# Patient Record
Sex: Female | Born: 1985
Health system: Southern US, Community
[De-identification: ages and names within clinical notes are randomized; demographics above are authoritative.]

## PROBLEM LIST (undated history)

## (undated) DIAGNOSIS — T7840XA Allergy, unspecified, initial encounter: Secondary | ICD-10-CM

## (undated) DIAGNOSIS — F32A Depression, unspecified: Secondary | ICD-10-CM

## (undated) DIAGNOSIS — G43909 Migraine, unspecified, not intractable, without status migrainosus: Secondary | ICD-10-CM

## (undated) DIAGNOSIS — R87629 Unspecified abnormal cytological findings in specimens from vagina: Secondary | ICD-10-CM

## (undated) DIAGNOSIS — D649 Anemia, unspecified: Secondary | ICD-10-CM

## (undated) DIAGNOSIS — F329 Major depressive disorder, single episode, unspecified: Secondary | ICD-10-CM

## (undated) DIAGNOSIS — F419 Anxiety disorder, unspecified: Secondary | ICD-10-CM

## (undated) DIAGNOSIS — F4310 Post-traumatic stress disorder, unspecified: Secondary | ICD-10-CM

## (undated) HISTORY — DX: Post-traumatic stress disorder, unspecified: F43.10

## (undated) HISTORY — PX: TONSILLECTOMY: SUR1361

## (undated) HISTORY — DX: Major depressive disorder, single episode, unspecified: F32.9

## (undated) HISTORY — PX: COSMETIC SURGERY: SHX468

## (undated) HISTORY — PX: WISDOM TOOTH EXTRACTION: SHX21

## (undated) HISTORY — DX: Migraine, unspecified, not intractable, without status migrainosus: G43.909

## (undated) HISTORY — DX: Anemia, unspecified: D64.9

## (undated) HISTORY — DX: Unspecified abnormal cytological findings in specimens from vagina: R87.629

## (undated) HISTORY — PX: BREAST REDUCTION SURGERY: SHX8

## (undated) HISTORY — DX: Depression, unspecified: F32.A

## (undated) HISTORY — DX: Anxiety disorder, unspecified: F41.9

## (undated) HISTORY — DX: Allergy, unspecified, initial encounter: T78.40XA

## (undated) HISTORY — PX: BREAST SURGERY: SHX581

---

## 2012-08-02 ENCOUNTER — Ambulatory Visit: Payer: Self-pay | Admitting: Physician Assistant

## 2012-08-02 VITALS — BP 110/70 | HR 72 | Temp 98.2°F | Resp 16 | Ht 67.0 in | Wt 173.0 lb

## 2012-08-02 DIAGNOSIS — Z0289 Encounter for other administrative examinations: Secondary | ICD-10-CM

## 2012-08-02 NOTE — Progress Notes (Signed)
Patient ID: Katherine Fletcher MRN: [id number], DOB: July 18, 1985 27 y.o. Date of Encounter: 08/02/2012, 12:20 PM  Primary Physician: No primary provider on file.  Chief Complaint: DOT Physical   HPI: 27 y.o. y/o female with history noted below here for DOT physical. Doing well. No issues/complaints. Generally healthy. Recertification. Usually gets a two year card. Rarely drinks alcohol if she drinks at all. If she drinks she will have a glass of wine a month. Eating healthy and exercising regularly.    Review of Systems: Consitutional: No fever, chills, fatigue, night sweats, lymphadenopathy, or weight changes. Eyes: No visual changes, eye redness, or discharge. ENT/Mouth: Ears: No otalgia, tinnitus, hearing loss, discharge. Nose: No congestion, rhinorrhea, sinus pain, or epistaxis. Throat: No sore throat, post nasal drip, or teeth pain. Cardiovascular: No CP, palpitations, diaphoresis, DOE, edema, orthopnea, PND. Respiratory: No cough, hemoptysis, SOB, or wheezing. Gastrointestinal: No anorexia, dysphagia, reflux, pain, nausea, vomiting, hematemesis, diarrhea, constipation, BRBPR, or melena. Genitourinary: No dysuria, frequency, urgency, hematuria, incontinence, nocturia, decreased urinary stream, discharge, impotence, or testicular pain/masses. Musculoskeletal: No decreased ROM, myalgias, stiffness, joint swelling, or weakness. Skin: No rash, erythema, lesion changes, pain, warmth, jaundice, or pruritis. Neurological: No headache, dizziness, syncope, seizures, tremors, memory loss, coordination problems, or paresthesias. Psychological: No anxiety, depression, hallucinations, SI/HI. Endocrine: No fatigue, polydipsia, polyphagia, polyuria, or known diabetes. All other systems were reviewed and are otherwise negative.  Past Medical History  Diagnosis Date  . Allergy   . Depression   . Migraine   . Anemia      Past Surgical History  Procedure Laterality Date  . Tonsillectomy    .  Wisdom tooth extraction      Home Meds:  Prior to Admission medications   Not on File    Allergies:  Allergies  Allergen Reactions  . Other     Opiates-GI Upset  . Latex Rash    History   Social History  . Marital Status: Married    Spouse Name: N/A    Number of Children: N/A  . Years of Education: N/A   Occupational History  . Not on file.   Social History Main Topics  . Smoking status: Never Smoker   . Smokeless tobacco: Never Used  . Alcohol Use: Yes  . Drug Use: No  . Sexually Active: No   Other Topics Concern  . Not on file   Social History Narrative  . No narrative on file    Family History  Problem Relation Age of Onset  . Obesity Mother   . Depression Mother   . Arthritis Mother   . Obesity Father   . Rheum arthritis Father   . Hypothyroidism Father   . Obesity Sister   . Polycystic ovary syndrome Sister   . Cancer Maternal Grandmother   . Heart disease Maternal Grandfather   . Hypothyroidism Paternal Grandmother   . Arthritis Paternal Grandmother   . Heart attack Paternal Grandfather   . Arthritis Paternal Grandfather     Physical Exam: Blood pressure 110/70, pulse 72, temperature 98.2 F (36.8 C), resp. rate 16, height 5\' 7"  (1.702 m), weight 173 lb (78.472 kg), last menstrual period 07/12/2012. Body mass index is 27.09 kg/(m^2).  General: Well developed, well nourished, in no acute distress. HEENT: Normocephalic, atraumatic. Conjunctiva pink, sclera non-icteric. Pupils 2 mm constricting to 1 mm, round, regular, and equally reactive to light and accomodation. EOMI. Internal auditory canal clear. TMs with good cone of light and without pathology. Nasal mucosa pink. Nares  are without discharge. No sinus tenderness. Oral mucosa pink. Dentition normal. Pharynx without exudate.   Neck: Supple. Trachea midline. No thyromegaly. Full ROM. No lymphadenopathy. Lungs: Clear to auscultation bilaterally without wheezes, rales, or rhonchi. Breathing is of  normal effort and unlabored. Cardiovascular: RRR with S1 S2. No murmurs, rubs, or gallops appreciated. Distal pulses 2+ symmetrically. No carotid or abdominal bruits. Abdomen: Soft, non-tender, non-distended with normoactive bowel sounds. No hepatosplenomegaly or masses. No rebound/guarding. No CVA tenderness. Without hernias.   Musculoskeletal: Full range of motion and 5/5 strength throughout. Without swelling, atrophy, tenderness, crepitus, or warmth. Extremities without clubbing, cyanosis, or edema. Calves supple. Skin: Warm and moist without erythema, ecchymosis, wounds, or rash. Neuro: A+Ox3. CN II-XII grossly intact. Moves all extremities spontaneously. Full sensation throughout. Normal gait. DTR 2+ throughout upper and lower extremities. Finger to nose intact. Psych:  Responds to questions appropriately with a normal affect.    Assessment/Plan:  27 y.o. y/o female here for DOT physical. -Cleared -Two year card issued -Form completed -RTC prn  Signed, Eula Listen, PA-C 08/02/2012 12:20 PM

## 2012-08-08 ENCOUNTER — Encounter: Payer: Self-pay | Admitting: Physician Assistant

## 2012-11-25 ENCOUNTER — Telehealth: Payer: Self-pay

## 2012-11-25 NOTE — Telephone Encounter (Signed)
Patient requested copy of DOT long form but back side was not scanned into Epic. Release form with incomplete DOT-PE long form is in [place] [facility]'s box for him to complete and sign the back which was not scanned in Epic. If the patient calls back asking about her release let her know DOT Card was faxed and DOT long form will be faxed as soon as it's completed.

## 2012-11-26 NOTE — Telephone Encounter (Signed)
Provider states he is unable to complete the back of the form due to urine dip not being documented in Epic. Form taken to [place] at [address] and she will call patient.

## 2014-09-03 ENCOUNTER — Ambulatory Visit (INDEPENDENT_AMBULATORY_CARE_PROVIDER_SITE_OTHER): Payer: Self-pay | Admitting: Family Medicine

## 2014-09-03 VITALS — BP 105/60 | HR 73 | Temp 98.0°F | Resp 16 | Ht 68.0 in | Wt 172.0 lb

## 2014-09-03 DIAGNOSIS — Z021 Encounter for pre-employment examination: Secondary | ICD-10-CM

## 2014-09-03 DIAGNOSIS — Z024 Encounter for examination for driving license: Secondary | ICD-10-CM

## 2014-09-03 NOTE — Patient Instructions (Signed)
Return in 2 years, sooner if needed

## 2014-09-03 NOTE — Progress Notes (Signed)
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 She has no major acute medical complaints.  Past medical history: Operations: Tonsillectomy Major illnesses: None; does have irritable bowel Allergies: None known Medications: She takes a number of supplements. Only prescription medicine is dicyclomine which she infrequently takes for irritable bowel.  Family history: Both parents are obese, have high cholesterol. Mother has high blood pressure.  Social history: Patient is married. She has a college degree in biology and monitoring and chemistry. She hopes to go to graduate school this fall [employer]. 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  Review of systems: Unremarkable. She has lost 150 pounds over the last 8 years by dietary change in exercise  Physical exam: Pleasant young lady in no acute distress. TMs normal. Eyes PERRLA. Throat clear. Neck supple without nodes or thyromegaly. Chest clear to auscultation. Heart regular without murmurs. Abdomen soft without mass or tenderness. Extremities unremarkable. Skin unremarkable.  Assessment: DOT physical exam History of irritable bowel syndrome Large weight loss, intentional lifestyle change  Plan: 2 year DOT card

## 2018-02-05 DIAGNOSIS — J329 Chronic sinusitis, unspecified: Secondary | ICD-10-CM | POA: Insufficient documentation

## 2018-02-27 DIAGNOSIS — J3081 Allergic rhinitis due to animal (cat) (dog) hair and dander: Secondary | ICD-10-CM | POA: Insufficient documentation

## 2019-03-05 ENCOUNTER — Other Ambulatory Visit: Payer: Self-pay | Admitting: *Deleted

## 2019-03-05 DIAGNOSIS — Z20822 Contact with and (suspected) exposure to covid-19: Secondary | ICD-10-CM

## 2019-03-06 LAB — NOVEL CORONAVIRUS, NAA: SARS-CoV-2, NAA: NOT DETECTED

## 2019-03-10 ENCOUNTER — Telehealth: Payer: Self-pay | Admitting: General Practice

## 2019-03-10 NOTE — Telephone Encounter (Signed)
Pt aware covid lab test negative, not detected °

## 2019-04-03 ENCOUNTER — Other Ambulatory Visit: Payer: Self-pay

## 2019-04-03 DIAGNOSIS — Z20822 Contact with and (suspected) exposure to covid-19: Secondary | ICD-10-CM

## 2019-04-05 LAB — NOVEL CORONAVIRUS, NAA: SARS-CoV-2, NAA: NOT DETECTED

## 2019-04-07 ENCOUNTER — Telehealth: Payer: Self-pay | Admitting: *Deleted

## 2019-04-07 NOTE — Telephone Encounter (Signed)
Pt calling for covid results; negative. Pt verbalizes understanding.

## 2019-05-06 DIAGNOSIS — B977 Papillomavirus as the cause of diseases classified elsewhere: Secondary | ICD-10-CM | POA: Insufficient documentation

## 2019-06-12 DIAGNOSIS — Z789 Other specified health status: Secondary | ICD-10-CM | POA: Insufficient documentation

## 2019-09-29 DIAGNOSIS — F39 Unspecified mood [affective] disorder: Secondary | ICD-10-CM | POA: Insufficient documentation

## 2019-11-04 DIAGNOSIS — Z98891 History of uterine scar from previous surgery: Secondary | ICD-10-CM

## 2019-11-04 HISTORY — DX: History of uterine scar from previous surgery: Z98.891

## 2020-05-10 ENCOUNTER — Ambulatory Visit (INDEPENDENT_AMBULATORY_CARE_PROVIDER_SITE_OTHER): Payer: Medicaid Other

## 2020-05-10 ENCOUNTER — Ambulatory Visit
Admission: RE | Admit: 2020-05-10 | Discharge: 2020-05-10 | Disposition: A | Discharge status: Home / Self Care | Payer: Medicaid Other | Source: Ambulatory Visit

## 2020-05-10 ENCOUNTER — Other Ambulatory Visit: Payer: Self-pay

## 2020-05-10 VITALS — BP 113/68 | HR 68 | Temp 98.3°F | Resp 16

## 2020-05-10 DIAGNOSIS — R4586 Emotional lability: Secondary | ICD-10-CM

## 2020-05-10 DIAGNOSIS — M25532 Pain in left wrist: Secondary | ICD-10-CM

## 2020-05-10 DIAGNOSIS — M659 Synovitis and tenosynovitis, unspecified: Secondary | ICD-10-CM | POA: Diagnosis not present

## 2020-05-10 DIAGNOSIS — M25522 Pain in left elbow: Secondary | ICD-10-CM

## 2020-05-10 DIAGNOSIS — M25512 Pain in left shoulder: Secondary | ICD-10-CM

## 2020-05-10 DIAGNOSIS — Z8659 Personal history of other mental and behavioral disorders: Secondary | ICD-10-CM

## 2020-05-10 MED ORDER — PREDNISONE 20 MG PO TABS: ORAL_TABLET | ORAL | 0 refills | Status: DC

## 2020-05-10 NOTE — ED Triage Notes (Signed)
Pt presents with left wrist pain for past few months, began in  August but has become worse Also, she has additional complaints of mood swings

## 2020-05-10 NOTE — ED Provider Notes (Signed)
[facility]   MRN: [id number] DOB: 1986-02-06  Subjective:   Katherine Fletcher is a 34 y.o. female presenting for several month history of persistent left wrist pain.  Has also had intermittent recurrent pain over the radial aspect of her proximal forearm, left shoulder pain.  Patient has a history of a left elbow injury.  She has a 47-month-old and admits that she has to do a lot of carrying and taking care of her son.  He is in >95% for his growth.  She has used ibuprofen and then naproxen but is starting to bother her stomach and is giving her very temporary relief.  She also has concerns about mood swings and would like to get blood work done for this.  She has a history of PTSD and has previously tried antidepressants but these have not generally helped her.  She has gone to behavioral therapy but is not doing this now.  No current facility-administered medications for this encounter.  Current Outpatient Medications:    norethindrone (MICRONOR) 0.35 MG tablet, Take 1 tablet by mouth daily., Disp: , Rfl:    No Known Allergies  No past medical history on file.    Family History  Family history unknown: Yes    Social History   Tobacco Use   Smoking status: Never Smoker   Smokeless tobacco: Never Used  Substance Use Topics   Alcohol use: Not Currently   Drug use: Never    ROS   Objective:   Vitals: BP 113/68 (BP Location: Right Arm)    Pulse 68    Temp 98.3 F (36.8 C) (Oral)    Resp 16    LMP 05/03/2020    SpO2 98%   Physical Exam Constitutional:      General: She is not in acute distress.    Appearance: Normal appearance. She is well-developed. She is not ill-appearing, toxic-appearing or diaphoretic.  HENT:     Head: Normocephalic and atraumatic.     Nose: Nose normal.     Mouth/Throat:     Mouth: Mucous membranes are moist.     Pharynx: Oropharynx is clear.  Eyes:     General: No scleral icterus.    Extraocular Movements: Extraocular  movements intact.     Pupils: Pupils are equal, round, and reactive to light.  Cardiovascular:     Rate and Rhythm: Normal rate.  Pulmonary:     Effort: Pulmonary effort is normal.  Musculoskeletal:       Arms:       Hands:  Skin:    General: Skin is warm and dry.  Neurological:     General: No focal deficit present.     Mental Status: She is alert and oriented to person, place, and time.  Psychiatric:        Mood and Affect: Mood normal.        Behavior: Behavior normal.     DG Wrist Complete Left  Result Date: 05/10/2020 CLINICAL DATA:  Left wrist pain for several months. No known injury. EXAM: LEFT WRIST - COMPLETE 3+ VIEW COMPARISON:  None. FINDINGS: There is no evidence of fracture or dislocation. There is no evidence of arthropathy or other focal bone abnormality. Soft tissues are unremarkable. IMPRESSION: Normal exam. Electronically Signed   By: Drusilla Kanner M.D.   On: 05/10/2020 11:25   Assessment and Plan :   PDMP not reviewed this encounter.  1. Tenosynovitis of left hand   2. Elbow pain, left   3.  Acute pain of left shoulder   4. Mood swings   5. History of posttraumatic stress disorder (PTSD)     Recommended oral prednisone course that she is responding some but not completely to NSAID course.  Counseled that having to perform a lot of childcare is a likely contributor to her musculoskeletal pain.  However, recommend that she follow-up with an orthopedist especially if she continues to have tenosynovitis of her left wrist/hand.  Follow-up and establish care with a new PCP, information provided to her for this.  Counseled against starting antidepressant medications until she has a good work-up. Counseled patient on potential for adverse effects with medications prescribed/recommended today, ER and return-to-clinic precautions discussed, patient verbalized understanding.    Wallis Bamberg, PA-C 05/10/20 1213

## 2020-05-11 DIAGNOSIS — M654 Radial styloid tenosynovitis [de Quervain]: Secondary | ICD-10-CM | POA: Diagnosis not present

## 2020-05-11 DIAGNOSIS — M25532 Pain in left wrist: Secondary | ICD-10-CM | POA: Diagnosis not present

## 2020-05-26 ENCOUNTER — Encounter: Payer: Self-pay | Admitting: Internal Medicine

## 2020-05-26 ENCOUNTER — Ambulatory Visit: Payer: Medicaid Other | Admitting: Internal Medicine

## 2020-05-26 ENCOUNTER — Other Ambulatory Visit: Payer: Self-pay

## 2020-05-26 VITALS — BP 97/63 | HR 86 | Temp 98.3°F | Resp 18 | Ht 68.0 in | Wt 190.1 lb

## 2020-05-26 DIAGNOSIS — F39 Unspecified mood [affective] disorder: Secondary | ICD-10-CM

## 2020-05-26 DIAGNOSIS — Z7689 Persons encountering health services in other specified circumstances: Secondary | ICD-10-CM

## 2020-05-26 DIAGNOSIS — R5383 Other fatigue: Secondary | ICD-10-CM | POA: Insufficient documentation

## 2020-05-26 DIAGNOSIS — D649 Anemia, unspecified: Secondary | ICD-10-CM | POA: Diagnosis not present

## 2020-05-26 NOTE — Assessment & Plan Note (Signed)
Reports h/o depression and PTSD Has tried Zoloft and Wellbutrin in the past Check CBC and TSH before starting treatment for depression Has had suicidal ideation in the past, no suicidal or homicidal ideation currently

## 2020-05-26 NOTE — Patient Instructions (Signed)
Please start taking Vitamin B12 supplements - total of 1000 mcg daily.  Continue to take Multivitamins and Omega-3.  Please continue to follow heart healthy diet and perform moderate exercise/walking at least 150 mins/week.

## 2020-05-26 NOTE — Progress Notes (Signed)
New Patient Office Visit  Subjective:  Patient ID: Katherine Fletcher, adult    DOB: 1986/02/09  Age: 36 y.o. MRN: [id number]  CC:  Chief Complaint  Patient presents with  . New Patient (Initial Visit)    New patient has been having mood swings she also would like to get iron levels checked and have her thyroid checked     HPI Katherine Fletcher is a 35 year old non-binary (female at birth) with PMH of anemia, depression and PTSD who presents for establishing care.  Patient had C-section in 10/2019. Patient started having depressed mood 2 months after that and has been having decreased appetite and does not like to perform activities that she used to like. Denies sleep difficulty, suicidal or homicidal ideation. Mentions h/o MDD and PTSD in the past, has been on Zoloft and Wellbutrin many years ago.  She has a h/o HPV, but last PAP smear was negative according to her. She follows up with OB/GYN.  Patient reports fatigue throughout the day and attributes it to decreased sleep as she is taking care of her infant.  Patient is up-to-date with COVID and flu vaccine.  Past Medical History:  Diagnosis Date  . Allergy   . Anemia   . Anxiety    Phreesia 05/23/2020  . Cesarean delivery delivered 11/21/2019  . Depression   . Depression    Phreesia 05/23/2020  . History of cesarean section 11/04/2019  . Migraine     Past Surgical History:  Procedure Laterality Date  . BREAST REDUCTION SURGERY    . BREAST SURGERY N/A    Phreesia 05/23/2020  . CESAREAN SECTION    . CESAREAN SECTION N/A    Phreesia 05/23/2020  . COSMETIC SURGERY N/A    Phreesia 05/23/2020  . TONSILLECTOMY    . WISDOM TOOTH EXTRACTION      Family History  Problem Relation Age of Onset  . Obesity Mother   . Depression Mother   . Arthritis Mother   . Obesity Father   . Rheum arthritis Father   . Hypothyroidism Father   . Obesity Sister   . Polycystic ovary syndrome Sister   . Cancer Maternal Grandmother   . Heart  disease Maternal Grandfather   . Hypothyroidism Paternal Grandmother   . Arthritis Paternal Grandmother   . Heart attack Paternal Grandfather   . Arthritis Paternal Grandfather     Social History   Socioeconomic History  . Marital status: Significant Other    Spouse name: Not on file  . Number of children: Not on file  . Years of education: Not on file  . Highest education level: Not on file  Occupational History  . Not on file  Tobacco Use  . Smoking status: Never Smoker  . Smokeless tobacco: Never Used  Substance and Sexual Activity  . Alcohol use: Yes  . Drug use: No  . Sexual activity: Never    Birth control/protection: Abstinence  Other Topics Concern  . Not on file  Social History Narrative   ** Merged History Encounter **       Social Determinants of Health   Financial Resource Strain: Not on file  Food Insecurity: Not on file  Transportation Needs: Not on file  Physical Activity: Not on file  Stress: Not on file  Social Connections: Not on file  Intimate Partner Violence: Not on file    ROS Review of Systems  Constitutional: Negative for chills and fever.  HENT: Negative for congestion and sore throat.  Eyes: Negative for pain and discharge.  Respiratory: Negative for cough and shortness of breath.   Cardiovascular: Negative for chest pain and palpitations.  Gastrointestinal: Negative for constipation, diarrhea, nausea and vomiting.  Endocrine: Negative for polydipsia and polyuria.  Genitourinary: Negative for dysuria and hematuria.  Musculoskeletal: Negative for neck pain and neck stiffness.  Skin: Negative for rash.  Neurological: Negative for dizziness, weakness, numbness and headaches.  Psychiatric/Behavioral: Positive for dysphoric mood. Negative for agitation, behavioral problems, sleep disturbance and suicidal ideas. The patient is not nervous/anxious.     Objective:   Today's Vitals: BP 97/63 (BP Location: Right Arm, Patient Position:  Sitting, Cuff Size: Normal)   Pulse 86   Temp 98.3 F (36.8 C) (Oral)   Resp 18   Ht '5\' 8"'  (1.727 m)   Wt 190 lb 1.9 oz (86.2 kg)   LMP 05/03/2020   SpO2 98%   BMI 28.91 kg/m   Physical Exam Vitals reviewed.  Constitutional:      General: Katherine Fletcher is not in acute distress.    Appearance: Katherine Fletcher is not diaphoretic.  HENT:     Head: Normocephalic and atraumatic.     Nose: Nose normal.     Mouth/Throat:     Mouth: Mucous membranes are moist.  Eyes:     General: No scleral icterus.    Extraocular Movements: Extraocular movements intact.     Pupils: Pupils are equal, round, and reactive to light.  Cardiovascular:     Rate and Rhythm: Normal rate and regular rhythm.     Pulses: Normal pulses.     Heart sounds: Normal heart sounds. No murmur heard.   Pulmonary:     Breath sounds: Normal breath sounds. No wheezing or rales.  Abdominal:     Palpations: Abdomen is soft.     Tenderness: There is no abdominal tenderness.  Musculoskeletal:     Cervical back: Neck supple. No tenderness.     Right lower leg: No edema.     Left lower leg: No edema.  Skin:    General: Skin is warm.     Findings: No rash.  Neurological:     General: No focal deficit present.     Mental Status: Katherine Fletcher is alert and oriented to person, place, and time.     Sensory: No sensory deficit.     Motor: No weakness.  Psychiatric:        Mood and Affect: Mood normal.        Behavior: Behavior normal.     Assessment & Plan:   Problem List Items Addressed This Visit      Encounter to establish care - Primary   Care established Previous chart reviewed History and medications reviewed with the patient     Relevant Orders  CBC with Differential/Platelet  CMP14+EGFR  Lipid Profile  TSH + free T4  Vitamin D (25 hydroxy)    Other   Mood disorder (HCC)    Reports h/o depression and PTSD Has tried Zoloft and Wellbutrin in the past Check CBC and TSH before starting treatment for  depression Has had suicidal ideation in the past, no suicidal or homicidal ideation currently      Fatigue    H/o anemia, check CBC, iron profile and TSH Could be related to caregiver stress and/or depression       Other Visit Diagnoses    Anemia, unspecified type Reports h/o anemia Advised to continue multivitamins for now Vitamin B12 supplements as she is  vegetarian.   Relevant Orders   Fe+TIBC+Fer      Outpatient Encounter Medications as of 05/26/2020  Medication Sig  . dicyclomine (BENTYL) 10 MG/5ML syrup Take 20 mg by mouth 4 (four) times daily -  before meals and at bedtime. (Patient not taking: Reported on 05/26/2020)  . [DISCONTINUED] norethindrone (MICRONOR) 0.35 MG tablet Take 1 tablet by mouth daily. (Patient not taking: Reported on 05/26/2020)  . [DISCONTINUED] predniSONE (DELTASONE) 20 MG tablet Take 2 tablets daily with breakfast. (Patient not taking: Reported on 05/26/2020)   No facility-administered encounter medications on file as of 05/26/2020.    Follow-up: Return in about 6 months (around 11/23/2020).   Lindell Spar, MD

## 2020-05-26 NOTE — Assessment & Plan Note (Signed)
H/o anemia, check CBC, iron profile and TSH Could be related to caregiver stress and/or depression

## 2020-05-26 NOTE — Assessment & Plan Note (Signed)
Care established Previous chart reviewed History and medications reviewed with the patient 

## 2020-05-27 LAB — VITAMIN D 25 HYDROXY (VIT D DEFICIENCY, FRACTURES): Vit D, 25-Hydroxy: 30.3 ng/mL (ref 30.0–100.0)

## 2020-05-27 LAB — LIPID PANEL
Chol/HDL Ratio: 3.3 ratio (ref 0.0–4.4)
Cholesterol, Total: 156 mg/dL (ref 100–199)
HDL: 47 mg/dL (ref >39)
LDL Chol Calc (NIH): 95 mg/dL (ref 0–99)
Triglycerides: 74 mg/dL (ref 0–149)
VLDL Cholesterol Cal: 14 mg/dL (ref 5–40)

## 2020-05-27 LAB — CBC WITH DIFFERENTIAL/PLATELET
Basophils Absolute: 0 10*3/uL (ref 0.0–0.2)
Basos: 0 %
EOS (ABSOLUTE): 0 10*3/uL (ref 0.0–0.4)
Eos: 0 %
Hematocrit: 42.2 % (ref 34.0–46.6)
Hemoglobin: 14.1 g/dL (ref 11.1–15.9)
Immature Grans (Abs): 0 10*3/uL (ref 0.0–0.1)
Immature Granulocytes: 0 %
Lymphocytes Absolute: 1.3 10*3/uL (ref 0.7–3.1)
Lymphs: 22 %
MCH: 33 pg (ref 26.6–33.0)
MCHC: 33.4 g/dL (ref 31.5–35.7)
MCV: 99 fL — ABNORMAL HIGH (ref 79–97)
Monocytes Absolute: 0.5 10*3/uL (ref 0.1–0.9)
Monocytes: 8 %
Neutrophils Absolute: 4 10*3/uL (ref 1.4–7.0)
Neutrophils: 70 %
Platelets: 144 10*3/uL — ABNORMAL LOW (ref 150–450)
RBC: 4.27 x10E6/uL (ref 3.77–5.28)
RDW: 12.2 % (ref 11.7–15.4)
WBC: 5.9 10*3/uL (ref 3.4–10.8)

## 2020-05-27 LAB — CMP14+EGFR
ALT: 13 IU/L (ref 0–32)
AST: 13 IU/L (ref 0–40)
Albumin/Globulin Ratio: 1.9 (ref 1.2–2.2)
Albumin: 4.2 g/dL (ref 3.8–4.8)
Alkaline Phosphatase: 53 IU/L (ref 44–121)
BUN/Creatinine Ratio: 28 — ABNORMAL HIGH (ref 9–23)
BUN: 17 mg/dL (ref 6–20)
Bilirubin Total: 0.5 mg/dL (ref 0.0–1.2)
CO2: 23 mmol/L (ref 20–29)
Calcium: 8.9 mg/dL (ref 8.7–10.2)
Chloride: 104 mmol/L (ref 96–106)
Creatinine, Ser: 0.61 mg/dL (ref 0.57–1.00)
GFR calc Af Amer: 137 mL/min/{1.73_m2} (ref >59)
GFR calc non Af Amer: 119 mL/min/{1.73_m2} (ref >59)
Globulin, Total: 2.2 g/dL (ref 1.5–4.5)
Glucose: 86 mg/dL (ref 65–99)
Potassium: 4.4 mmol/L (ref 3.5–5.2)
Sodium: 142 mmol/L (ref 134–144)
Total Protein: 6.4 g/dL (ref 6.0–8.5)

## 2020-05-27 LAB — TSH+FREE T4
Free T4: 1.1 ng/dL (ref 0.82–1.77)
TSH: 1.12 u[IU]/mL (ref 0.450–4.500)

## 2020-06-30 DIAGNOSIS — Z20822 Contact with and (suspected) exposure to covid-19: Secondary | ICD-10-CM | POA: Diagnosis not present

## 2020-07-20 ENCOUNTER — Other Ambulatory Visit: Payer: Self-pay

## 2020-07-20 NOTE — Patient Instructions (Signed)
Visit Information    Katherine Fletcher  - as a part of your Medicaid benefit, you are eligible for care management and care coordination services at no cost or copay. I was unable to reach you by phone today but would be happy to help you with your health related needs. Please feel free to call me @ [phone].   A member of the Managed Medicaid care management team will reach out to you again over the next 7 days.   Gus Puma, BSW, [facility]  [place]  High Risk Managed Medicaid Team

## 2020-07-20 NOTE — Patient Outreach (Signed)
Care Coordination  07/20/2020  [place] Date February 07, 1986 [id number]   Medicaid Managed Care   Unsuccessful Outreach Note  07/20/2020 Name: Katherine Fletcher MRN: [id number] DOB: 02-03-1986  Referred by: Anabel Halon, MD Reason for referral : High Risk Managed Medicaid (MM Unsuccessful Telephone Outreach)   An unsuccessful telephone outreach was attempted today. The patient was referred to the case management team for assistance with care management and care coordination.   Follow Up Plan: The care management team will reach out to the patient again over the next 7 days.   Gus Puma, BSW, [facility]  [place]  High Risk Managed Medicaid Team

## 2020-07-29 ENCOUNTER — Other Ambulatory Visit: Payer: Self-pay

## 2020-07-29 NOTE — Patient Outreach (Signed)
Care Coordination  07/29/2020  [name] 14-May-1986 [id number]   Medicaid Managed Care   Unsuccessful Outreach Note  07/29/2020 Name: Katherine Fletcher MRN: [id number] DOB: February 07, 1986  Referred by: Anabel Halon, MD Reason for referral : High Risk Managed Medicaid (MM Screen Unsuccessful Telephone Outreach)   A second unsuccessful telephone outreach was attempted today. The patient was referred to the case management team for assistance with care management and care coordination.   Follow Up Plan: The care management team will reach out to the patient again over the next 7 days.   Gus Puma, BSW, [facility]  [name]  High Risk Managed Medicaid Team

## 2020-07-29 NOTE — Patient Instructions (Signed)
Visit Information    Katherine Fletcher  - as a part of your Medicaid benefit, you are eligible for care management and care coordination services at no cost or copay. I was unable to reach you by phone today but would be happy to help you with your health related needs. Please feel free to call me @ [phone].   A member of the Managed Medicaid care management team will reach out to you again over the next 7 days.   Katherine Fletcher, BSW, [facility]  [place]  High Risk Managed Medicaid Team

## 2020-08-09 ENCOUNTER — Other Ambulatory Visit: Payer: Self-pay

## 2020-08-09 NOTE — Patient Instructions (Signed)
Visit Information    Katherine Fletcher  - as a part of your Medicaid benefit, you are eligible for care management and care coordination services at no cost or copay. I was unable to reach you by phone today but would be happy to help you with your health related needs. Please feel free to call me @ [phone].     Gus Puma, BSW, [facility]  [place]  High Risk Managed Medicaid Team

## 2020-08-09 NOTE — Patient Outreach (Signed)
Care Coordination  08/09/2020  Katherine Fletcher 11/18/1985 [id number]   Medicaid Managed Care   Unsuccessful Outreach Note  08/09/2020 Name: Katherine Fletcher MRN: [id number] DOB: [birth year]  Referred by: Anabel Halon, MD Reason for referral : High Risk Managed Medicaid (MM Screen Unsuccessful Telephone Outreach)   Third unsuccessful telephone outreach was attempted today. The patient was referred to the case management team for assistance with care management and care coordination. The patient's primary care provider has been notified of our unsuccessful attempts to make or maintain contact with the patient. The care management team is pleased to engage with this patient at any time in the future should he/she be interested in assistance from the care management team.   Follow Up Plan: The patient has been provided with contact information for the care management team and has been advised to call with any health related questions or concerns.   Gus Puma, BSW, [facility]  [place]  High Risk Managed Medicaid Team

## 2020-10-25 ENCOUNTER — Ambulatory Visit (INDEPENDENT_AMBULATORY_CARE_PROVIDER_SITE_OTHER): Payer: Medicaid Other | Admitting: Adult Health

## 2020-10-25 ENCOUNTER — Other Ambulatory Visit: Payer: Self-pay

## 2020-10-25 ENCOUNTER — Encounter: Payer: Self-pay | Admitting: Adult Health

## 2020-10-25 VITALS — BP 109/65 | HR 80 | Ht 69.0 in | Wt 192.0 lb

## 2020-10-25 DIAGNOSIS — Z3A01 Less than 8 weeks gestation of pregnancy: Secondary | ICD-10-CM | POA: Diagnosis not present

## 2020-10-25 DIAGNOSIS — Z3201 Encounter for pregnancy test, result positive: Secondary | ICD-10-CM | POA: Diagnosis not present

## 2020-10-25 DIAGNOSIS — Z98891 History of uterine scar from previous surgery: Secondary | ICD-10-CM | POA: Diagnosis not present

## 2020-10-25 DIAGNOSIS — O3680X0 Pregnancy with inconclusive fetal viability, not applicable or unspecified: Secondary | ICD-10-CM | POA: Diagnosis not present

## 2020-10-25 LAB — POCT URINE PREGNANCY: Preg Test, Ur: POSITIVE — AB

## 2020-10-25 NOTE — Progress Notes (Signed)
  Subjective:     Patient ID: Katherine Fletcher, adult   DOB: May 02, 1986, 35 y.o.   MRN: [id number]  HPI Katherine Fletcher is a 35 year old white female, single with SO, in for UPT, has missed a period. Katherine Fletcher is her partner. Pap was 10/14/2018 +HPV other PCP is Dr Allena Katz.  Review of Systems +missed period Some nausea Reviewed past medical,surgical, social and family history. Reviewed medications and allergies.     Objective:   Physical Exam BP 109/65 (BP Location: Right Arm, Patient Position: Sitting, Cuff Size: Normal)   Pulse 80   Ht 5\' 9"  (1.753 m)   Wt 192 lb (87.1 kg)   LMP 09/16/2020 (Exact Date)   BMI 28.35 kg/m UPT is positive. About 5+4 weeks by LMP with EDD 06/23/21. Skin warm and dry. Neck: mid line trachea, normal thyroid, good ROM, no lymphadenopathy noted. Lungs: clear to ausculation bilaterally. Cardiovascular: regular rate and rhythm.abdomen is soft and non tender. AA is 3 Fall risk is low Depression screen [facility] 2/9 10/25/2020 05/26/2020  Decreased Interest 1 3  Down, Depressed, Hopeless 1 3  PHQ - 2 Score 2 6  Altered sleeping 2 1  Tired, decreased energy 3 1  Change in appetite 1 2  Feeling bad or failure about yourself  2 3  Trouble concentrating 1 0  Moving slowly or fidgety/restless 0 3  Suicidal thoughts 0 0  PHQ-9 Score 11 16  Difficult doing work/chores - Somewhat difficult  Never took prozac.  GAD 7 : Generalized Anxiety Score 10/25/2020  Nervous, Anxious, on Edge 2  Control/stop worrying 2  Worry too much - different things 2  Trouble relaxing 2  Restless 1  Easily annoyed or irritable 3  Afraid - awful might happen 0  Total GAD 7 Score 12       Assessment:     1. Pregnancy test positive   2. Less than [redacted] weeks gestation of pregnancy Take OTC PNC  3. Encounter to determine fetal viability of pregnancy, single or unspecified fetus Return in 3 weeks for dating 12/25/2020  4. History of cesarean section Does not want repeat, had at [facility], due to cord was  wrapped     Plan:     Review handout by [facility]

## 2020-11-10 ENCOUNTER — Encounter: Payer: Medicaid Other | Admitting: Adult Health

## 2020-11-17 ENCOUNTER — Other Ambulatory Visit: Payer: Self-pay

## 2020-11-17 ENCOUNTER — Other Ambulatory Visit: Payer: Self-pay | Admitting: Adult Health

## 2020-11-17 ENCOUNTER — Ambulatory Visit (INDEPENDENT_AMBULATORY_CARE_PROVIDER_SITE_OTHER): Payer: Medicaid Other

## 2020-11-17 DIAGNOSIS — Z3A08 8 weeks gestation of pregnancy: Secondary | ICD-10-CM | POA: Diagnosis not present

## 2020-11-17 DIAGNOSIS — O3680X0 Pregnancy with inconclusive fetal viability, not applicable or unspecified: Secondary | ICD-10-CM

## 2020-11-23 ENCOUNTER — Ambulatory Visit: Payer: Medicaid Other | Admitting: Internal Medicine

## 2020-12-08 ENCOUNTER — Other Ambulatory Visit: Payer: Self-pay | Admitting: Obstetrics & Gynecology

## 2020-12-08 DIAGNOSIS — Z3682 Encounter for antenatal screening for nuchal translucency: Secondary | ICD-10-CM

## 2020-12-09 ENCOUNTER — Other Ambulatory Visit: Payer: Self-pay | Admitting: Obstetrics & Gynecology

## 2020-12-09 ENCOUNTER — Ambulatory Visit (INDEPENDENT_AMBULATORY_CARE_PROVIDER_SITE_OTHER): Payer: Medicaid Other | Admitting: Women's Health

## 2020-12-09 ENCOUNTER — Ambulatory Visit: Payer: Medicaid Other | Admitting: *Deleted

## 2020-12-09 ENCOUNTER — Encounter: Payer: Self-pay | Admitting: Women's Health

## 2020-12-09 ENCOUNTER — Other Ambulatory Visit: Payer: Self-pay

## 2020-12-09 ENCOUNTER — Ambulatory Visit (INDEPENDENT_AMBULATORY_CARE_PROVIDER_SITE_OTHER): Payer: Medicaid Other

## 2020-12-09 ENCOUNTER — Other Ambulatory Visit: Payer: Self-pay | Admitting: Women's Health

## 2020-12-09 VITALS — BP 120/72 | HR 72 | Wt 195.0 lb

## 2020-12-09 DIAGNOSIS — Z3682 Encounter for antenatal screening for nuchal translucency: Secondary | ICD-10-CM | POA: Diagnosis not present

## 2020-12-09 DIAGNOSIS — Z98891 History of uterine scar from previous surgery: Secondary | ICD-10-CM

## 2020-12-09 DIAGNOSIS — Z3481 Encounter for supervision of other normal pregnancy, first trimester: Secondary | ICD-10-CM

## 2020-12-09 DIAGNOSIS — Z3480 Encounter for supervision of other normal pregnancy, unspecified trimester: Secondary | ICD-10-CM | POA: Diagnosis not present

## 2020-12-09 DIAGNOSIS — Z3A12 12 weeks gestation of pregnancy: Secondary | ICD-10-CM

## 2020-12-09 DIAGNOSIS — F418 Other specified anxiety disorders: Secondary | ICD-10-CM | POA: Diagnosis not present

## 2020-12-09 DIAGNOSIS — Z3490 Encounter for supervision of normal pregnancy, unspecified, unspecified trimester: Secondary | ICD-10-CM | POA: Insufficient documentation

## 2020-12-09 LAB — POCT URINALYSIS DIPSTICK OB
Blood, UA: NEGATIVE
Glucose, UA: NEGATIVE
Ketones, UA: NEGATIVE
Leukocytes, UA: NEGATIVE
Nitrite, UA: NEGATIVE
POC,PROTEIN,UA: NEGATIVE

## 2020-12-09 NOTE — Patient Instructions (Signed)
Katherine Fletcher, thank you for choosing our office today! We appreciate the opportunity to meet your healthcare needs. You may receive a short survey by mail, e-mail, or through MyChart. If you are happy with your care we would appreciate if you could take just a few minutes to complete the survey questions. We read all of your comments and take your feedback very seriously. Thank you again for choosing our office.  [facility]  [facility] ([address] [place], [place] [address]) Entrance C, located off of [address] Free 24/7 valet parking   Nausea & Vomiting Have saltine crackers or pretzels by your bed and eat a few bites before you raise your head out of bed in the morning Eat small frequent meals throughout the day instead of large meals Drink plenty of fluids throughout the day to stay hydrated, just don't drink a lot of fluids with your meals.  This can make your stomach fill up faster making you feel sick Do not brush your teeth right after you eat Products with real ginger are good for nausea, like ginger ale and ginger hard candy Make sure it says made with real ginger! Sucking on sour candy like lemon heads is also good for nausea If your prenatal vitamins make you nauseated, take them at night so you will sleep through the nausea Sea Bands If you feel like you need medicine for the nausea & vomiting please let us know If you are unable to keep any fluids or food down please let us know   Constipation Drink plenty of fluid, preferably water, throughout the day Eat foods high in fiber such as fruits, vegetables, and grains Exercise, such as walking, is a good way to keep your bowels regular Drink warm fluids, especially warm prune juice, or decaf coffee Eat a 1/2 cup of real oatmeal (not instant), 1/2 cup applesauce, and 1/2-1 cup warm prune juice every day If needed, you may take Colace (docusate sodium) stool softener  once or twice a day to help keep the stool soft.  If you still are having problems with constipation, you may take Miralax once daily as needed to help keep your bowels regular.   Home Blood Pressure Monitoring for Patients   Your provider has recommended that you check your blood pressure (BP) at least once a week at home. If you do not have a blood pressure cuff at home, one will be provided for you. Contact your provider if you have not received your monitor within 1 week.   Helpful Tips for Accurate Home Blood Pressure Checks  Don't smoke, exercise, or drink caffeine 30 minutes before checking your BP Use the restroom before checking your BP (a full bladder can raise your pressure) Relax in a comfortable upright chair Feet on the ground Left arm resting comfortably on a flat surface at the level of your heart Legs uncrossed Back supported Sit quietly and don't talk Place the cuff on your bare arm Adjust snuggly, so that only two fingertips can fit between your skin and the top of the cuff Check 2 readings separated by at least one minute Keep a log of your BP readings For a visual, please reference this diagram: http://ccnc.care/bpdiagram  Provider Name: [facility]     Phone: [phone]  Zone 1: ALL CLEAR  Continue to monitor your symptoms:  BP reading is less than 140 (top number) or less than 90 (bottom   number)  No right upper stomach pain No headaches or seeing spots No feeling nauseated or throwing up No swelling in face and hands  Zone 2: CAUTION Call your doctor's office for any of the following:  BP reading is greater than 140 (top number) or greater than 90 (bottom number)  Stomach pain under your ribs in the middle or right side Headaches or seeing spots Feeling nauseated or throwing up Swelling in face and hands  Zone 3: EMERGENCY  Seek immediate medical care if you have any of the following:  BP reading is greater than160 (top number) or greater than  110 (bottom number) Severe headaches not improving with Tylenol Serious difficulty catching your breath Any worsening symptoms from Zone 2    First Trimester of Pregnancy The first trimester of pregnancy is from week 1 until the end of week 12 (months 1 through 3). A week after a sperm fertilizes an egg, the egg will implant on the wall of the uterus. This embryo will begin to develop into a baby. Genes from you and your partner are forming the baby. The female genes determine whether the baby is a boy or a girl. At 6-8 weeks, the eyes and face are formed, and the heartbeat can be seen on ultrasound. At the end of 12 weeks, all the baby's organs are formed.  Now that you are pregnant, you will want to do everything you can to have a healthy baby. Two of the most important things are to get good prenatal care and to follow your health care provider's instructions. Prenatal care is all the medical care you receive before the baby's birth. This care will help prevent, find, and treat any problems during the pregnancy and childbirth. BODY CHANGES Your body goes through many changes during pregnancy. The changes vary from woman to woman.  You may gain or lose a couple of pounds at first. You may feel sick to your stomach (nauseous) and throw up (vomit). If the vomiting is uncontrollable, call your health care provider. You may tire easily. You may develop headaches that can be relieved by medicines approved by your health care provider. You may urinate more often. Painful urination may mean you have a bladder infection. You may develop heartburn as a result of your pregnancy. You may develop constipation because certain hormones are causing the muscles that push waste through your intestines to slow down. You may develop hemorrhoids or swollen, bulging veins (varicose veins). Your breasts may begin to grow larger and become tender. Your nipples may stick out more, and the tissue that surrounds them  (areola) may become darker. Your gums may bleed and may be sensitive to brushing and flossing. Dark spots or blotches (chloasma, mask of pregnancy) may develop on your face. This will likely fade after the baby is born. Your menstrual periods will stop. You may have a loss of appetite. You may develop cravings for certain kinds of food. You may have changes in your emotions from day to day, such as being excited to be pregnant or being concerned that something may go wrong with the pregnancy and baby. You may have more vivid and strange dreams. You may have changes in your hair. These can include thickening of your hair, rapid growth, and changes in texture. Some women also have hair loss during or after pregnancy, or hair that feels dry or thin. Your hair will most likely return to normal after your baby is born. WHAT TO EXPECT AT YOUR PRENATAL   VISITS During a routine prenatal visit: You will be weighed to make sure you and the baby are growing normally. Your blood pressure will be taken. Your abdomen will be measured to track your baby's growth. The fetal heartbeat will be listened to starting around week 10 or 12 of your pregnancy. Test results from any previous visits will be discussed. Your health care provider may ask you: How you are feeling. If you are feeling the baby move. If you have had any abnormal symptoms, such as leaking fluid, bleeding, severe headaches, or abdominal cramping. If you have any questions. Other tests that may be performed during your first trimester include: Blood tests to find your blood type and to check for the presence of any previous infections. They will also be used to check for low iron levels (anemia) and Rh antibodies. Later in the pregnancy, blood tests for diabetes will be done along with other tests if problems develop. Urine tests to check for infections, diabetes, or protein in the urine. An ultrasound to confirm the proper growth and development  of the baby. An amniocentesis to check for possible genetic problems. Fetal screens for spina bifida and Down syndrome. You may need other tests to make sure you and the baby are doing well. HOME CARE INSTRUCTIONS  Medicines Follow your health care provider's instructions regarding medicine use. Specific medicines may be either safe or unsafe to take during pregnancy. Take your prenatal vitamins as directed. If you develop constipation, try taking a stool softener if your health care provider approves. Diet Eat regular, well-balanced meals. Choose a variety of foods, such as meat or vegetable-based protein, fish, milk and low-fat dairy products, vegetables, fruits, and whole grain breads and cereals. Your health care provider will help you determine the amount of weight gain that is right for you. Avoid raw meat and uncooked cheese. These carry germs that can cause birth defects in the baby. Eating four or five small meals rather than three large meals a day may help relieve nausea and vomiting. If you start to feel nauseous, eating a few soda crackers can be helpful. Drinking liquids between meals instead of during meals also seems to help nausea and vomiting. If you develop constipation, eat more high-fiber foods, such as fresh vegetables or fruit and whole grains. Drink enough fluids to keep your urine clear or pale yellow. Activity and Exercise Exercise only as directed by your health care provider. Exercising will help you: Control your weight. Stay in shape. Be prepared for labor and delivery. Experiencing pain or cramping in the lower abdomen or low back is a good sign that you should stop exercising. Check with your health care provider before continuing normal exercises. Try to avoid standing for long periods of time. Move your legs often if you must stand in one place for a long time. Avoid heavy lifting. Wear low-heeled shoes, and practice good posture. You may continue to have sex  unless your health care provider directs you otherwise. Relief of Pain or Discomfort Wear a good support bra for breast tenderness.   Take warm sitz baths to soothe any pain or discomfort caused by hemorrhoids. Use hemorrhoid cream if your health care provider approves.   Rest with your legs elevated if you have leg cramps or low back pain. If you develop varicose veins in your legs, wear support hose. Elevate your feet for 15 minutes, 3-4 times a day. Limit salt in your diet. Prenatal Care Schedule your prenatal visits by the   twelfth week of pregnancy. They are usually scheduled monthly at first, then more often in the last 2 months before delivery. Write down your questions. Take them to your prenatal visits. Keep all your prenatal visits as directed by your health care provider. Safety Wear your seat belt at all times when driving. Make a list of emergency phone numbers, including numbers for family, friends, the hospital, and police and fire departments. General Tips Ask your health care provider for a referral to a local prenatal education class. Begin classes no later than at the beginning of month 6 of your pregnancy. Ask for help if you have counseling or nutritional needs during pregnancy. Your health care provider can offer advice or refer you to specialists for help with various needs. Do not use hot tubs, steam rooms, or saunas. Do not douche or use tampons or scented sanitary pads. Do not cross your legs for long periods of time. Avoid cat litter boxes and soil used by cats. These carry germs that can cause birth defects in the baby and possibly loss of the fetus by miscarriage or stillbirth. Avoid all smoking, herbs, alcohol, and medicines not prescribed by your health care provider. Chemicals in these affect the formation and growth of the baby. Schedule a dentist appointment. At home, brush your teeth with a soft toothbrush and be gentle when you floss. SEEK MEDICAL CARE IF:   You have dizziness. You have mild pelvic cramps, pelvic pressure, or nagging pain in the abdominal area. You have persistent nausea, vomiting, or diarrhea. You have a bad smelling vaginal discharge. You have pain with urination. You notice increased swelling in your face, hands, legs, or ankles. SEEK IMMEDIATE MEDICAL CARE IF:  You have a fever. You are leaking fluid from your vagina. You have spotting or bleeding from your vagina. You have severe abdominal cramping or pain. You have rapid weight gain or loss. You vomit blood or material that looks like coffee grounds. You are exposed to German measles and have never had them. You are exposed to fifth disease or chickenpox. You develop a severe headache. You have shortness of breath. You have any kind of trauma, such as from a fall or a car accident. Document Released: 05/02/2001 Document Revised: 09/22/2013 Document Reviewed: 03/18/2013 ExitCare Patient Information 2015 [employer]. This information is not intended to replace advice given to you by your health care provider. Make sure you discuss any questions you have with your health care provider.  

## 2020-12-09 NOTE — Progress Notes (Signed)
[place] 12 wks,measurements c/w dates,crl 58.73 mm,NT 1.4 mm,NB present,fhr 164 bpm,normal ovaries

## 2020-12-09 NOTE — Progress Notes (Signed)
INITIAL OBSTETRICAL VISIT Patient name: Katherine Fletcher MRN [id number]  Date of birth: 14-May-1986 Chief Complaint:   Initial Prenatal Visit  History of Present Illness:   Katherine Fletcher is a 35 y.o. G54P1001 Caucasian female at [redacted]w[redacted]d by LMP c/w u/s at 8 weeks with an Estimated Date of Delivery: 06/23/21 being seen today for her initial obstetrical visit.   Patient's last menstrual period was 09/16/2020 (exact date). Her obstetrical history is significant for C/S for NRFHR, wants TOLAC.   Today she reports nausea, unisom helps Last pap 11/21/19. Results were: NILM w/ HRHPV negative  Depression screen [facility] 2/9 12/09/2020 10/25/2020 05/26/2020  Decreased Interest 1 1 3   Down, Depressed, Hopeless 1 1 3   PHQ - 2 Score 2 2 6   Altered sleeping 0 2 1  Tired, decreased energy 3 3 1   Change in appetite 1 1 2   Feeling bad or failure about yourself  2 2 3   Trouble concentrating 1 1 0  Moving slowly or fidgety/restless 0 0 3  Suicidal thoughts 0 0 0  PHQ-9 Score 9 11 16   Difficult doing work/chores - - Somewhat difficult     GAD 7 : Generalized Anxiety Score 12/09/2020 10/25/2020  Nervous, Anxious, on Edge 1 2  Control/stop worrying 1 2  Worry too much - different things 1 2  Trouble relaxing 2 2  Restless 0 1  Easily annoyed or irritable 3 3  Afraid - awful might happen 0 0  Total GAD 7 Score 8 12     Review of Systems:   Pertinent items are noted in HPI Denies cramping/contractions, leakage of fluid, vaginal bleeding, abnormal vaginal discharge w/ itching/odor/irritation, headaches, visual changes, shortness of breath, chest pain, abdominal pain, severe nausea/vomiting, or problems with urination or bowel movements unless otherwise stated above.  Pertinent History Reviewed:  Reviewed past medical,surgical, social, obstetrical and family history.  Reviewed problem list, medications and allergies. OB History  Gravida Para Term Preterm AB Living  2 1 1     1   SAB IAB Ectopic Multiple Live Births           1    # Outcome Date GA Lbr Len/2nd Weight Sex Delivery Anes PTL Lv  2 Current           1 Term 10/27/19 [redacted]w[redacted]d  6 lb 12 oz (3.062 kg) M CS-LTranv EPI N LIV     Complications: Fetal Intolerance   Physical Assessment:   Vitals:   12/09/20 1059  BP: 120/72  Pulse: 72  Weight: 195 lb (88.5 kg)  Body mass index is 28.8 kg/m.       Physical Examination:  General appearance - well appearing, and in no distress  Mental status - alert, oriented to person, place, and time  Psych:  She has a normal mood and affect  Skin - warm and dry, normal color, no suspicious lesions noted  Chest - effort normal, all lung fields clear to auscultation bilaterally  Heart - normal rate and regular rhythm  Abdomen - soft, nontender  Extremities:  No swelling or varicosities noted  Thin prep pap is not done   Chaperone: N/A    TODAY'S NT  TA/TV:12 wks,measurements c/w dates,crl 58.73 mm,NT 1.4 mm,NB present,fhr 164 bpm,normal ovaries,limited view because of pt body habitus  Results for orders placed or performed in visit on 12/09/20 (from the past 24 hour(s))  POC Urinalysis Dipstick OB   Collection Time: 12/09/20 11:45 AM  Result Value Ref Range  Color, UA     Clarity, UA     Glucose, UA Negative Negative   Bilirubin, UA     Ketones, UA neg    Spec Grav, UA     Blood, UA neg    pH, UA     POC,PROTEIN,UA Negative Negative, Trace, Small (1+), Moderate (2+), Large (3+), 4+   Urobilinogen, UA     Nitrite, UA neg    Leukocytes, UA Negative Negative   Appearance     Odor      Assessment & Plan:  1) Low-Risk Pregnancy G2P1001 at [redacted]w[redacted]d with an Estimated Date of Delivery: 06/23/21   2) Initial OB visit  3) Prev c/s> for [facility], wants TOLAC, consent given to review  4) Dep/anx/PTSD/Bipolar> no meds, stable  Meds: No orders of the defined types were placed in this encounter.   Initial labs obtained Continue prenatal vitamins Reviewed n/v relief measures and warning s/s to  report Reviewed recommended weight gain based on pre-gravid BMI Encouraged well-balanced diet Genetic & carrier screening discussed: requests Panorama and NT/IT, declines Horizon  Ultrasound discussed; fetal survey: requested CCNC completed> form faxed if has or is planning to apply for medicaid The nature of [place] [facility] [employer] with multiple MDs and other Advanced Practice Providers was explained to patient; also emphasized that fellows, residents, and students are part of our team. Does have home bp cuff. Office bp cuff given: no. Rx sent: no. Check bp weekly, let us know if consistently >140/90.   Follow-up: Return in about 4 weeks (around 01/06/2021) for LROB, 2nd IT, CNM, in person.   Orders Placed This Encounter  Procedures   Urine Culture   GC/Chlamydia Probe Amp   Integrated 1   Genetic Screening   Pain Management Screening Profile (10S)   Hgb Fractionation Cascade   CBC/D/Plt+RPR+Rh+ABO+RubIgG...   POC Urinalysis Dipstick OB    Cheral Marker CNM, [facility] 12/09/2020 12:08 PM

## 2020-12-10 LAB — PMP SCREEN PROFILE (10S), URINE
Amphetamine Scrn, Ur: NEGATIVE ng/mL
BARBITURATE SCREEN URINE: NEGATIVE ng/mL
BENZODIAZEPINE SCREEN, URINE: NEGATIVE ng/mL
CANNABINOIDS UR QL SCN: NEGATIVE ng/mL
Cocaine (Metab) Scrn, Ur: NEGATIVE ng/mL
Creatinine(Crt), U: 65.5 mg/dL (ref 20.0–300.0)
Methadone Screen, Urine: NEGATIVE ng/mL
OXYCODONE+OXYMORPHONE UR QL SCN: NEGATIVE ng/mL
Opiate Scrn, Ur: NEGATIVE ng/mL
Ph of Urine: 7.5 (ref 4.5–8.9)
Phencyclidine Qn, Ur: NEGATIVE ng/mL
Propoxyphene Scrn, Ur: NEGATIVE ng/mL

## 2020-12-11 LAB — URINE CULTURE

## 2020-12-12 LAB — GC/CHLAMYDIA PROBE AMP
Chlamydia trachomatis, NAA: NEGATIVE
Neisseria Gonorrhoeae by PCR: NEGATIVE

## 2020-12-13 LAB — INTEGRATED 1
Crown Rump Length: 58.7 mm
Gest. Age on Collection Date: 12.1 weeks
Maternal Age at EDD: 35.5 yr
Nuchal Translucency (NT): 1.4 mm
Number of Fetuses: 1
PAPP-A Value: 1008.3 ng/mL
Weight: 195 [lb_av]

## 2020-12-13 LAB — HGB FRACTIONATION CASCADE
Hgb A2: 2.7 % (ref 1.8–3.2)
Hgb A: 97.3 % (ref 96.4–98.8)
Hgb F: 0 % (ref 0.0–2.0)
Hgb S: 0 %

## 2020-12-13 LAB — CBC/D/PLT+RPR+RH+ABO+RUBIGG...
Antibody Screen: NEGATIVE
Basophils Absolute: 0 10*3/uL (ref 0.0–0.2)
Basos: 0 %
EOS (ABSOLUTE): 0 10*3/uL (ref 0.0–0.4)
Eos: 0 %
HCV Ab: 0.2 s/co ratio (ref 0.0–0.9)
HIV Screen 4th Generation wRfx: NONREACTIVE
Hematocrit: 40.6 % (ref 34.0–46.6)
Hemoglobin: 14 g/dL (ref 11.1–15.9)
Hepatitis B Surface Ag: NEGATIVE
Immature Grans (Abs): 0 10*3/uL (ref 0.0–0.1)
Immature Granulocytes: 0 %
Lymphocytes Absolute: 1.5 10*3/uL (ref 0.7–3.1)
Lymphs: 21 %
MCH: 33.1 pg — ABNORMAL HIGH (ref 26.6–33.0)
MCHC: 34.5 g/dL (ref 31.5–35.7)
MCV: 96 fL (ref 79–97)
Monocytes Absolute: 0.4 10*3/uL (ref 0.1–0.9)
Monocytes: 6 %
Neutrophils Absolute: 5.2 10*3/uL (ref 1.4–7.0)
Neutrophils: 73 %
Platelets: 173 10*3/uL (ref 150–450)
RBC: 4.23 x10E6/uL (ref 3.77–5.28)
RDW: 12.7 % (ref 11.7–15.4)
RPR Ser Ql: NONREACTIVE
Rh Factor: POSITIVE
Rubella Antibodies, IGG: 3.46 index (ref >0.99)
WBC: 7.1 10*3/uL (ref 3.4–10.8)

## 2020-12-13 LAB — HCV INTERPRETATION

## 2021-01-03 ENCOUNTER — Ambulatory Visit: Payer: Medicaid Other | Admitting: Internal Medicine

## 2021-01-03 ENCOUNTER — Encounter: Payer: Self-pay | Admitting: Women's Health

## 2021-01-06 ENCOUNTER — Encounter: Payer: Self-pay | Admitting: Women's Health

## 2021-01-06 ENCOUNTER — Ambulatory Visit (INDEPENDENT_AMBULATORY_CARE_PROVIDER_SITE_OTHER): Payer: Medicaid Other | Admitting: Women's Health

## 2021-01-06 ENCOUNTER — Other Ambulatory Visit: Payer: Self-pay

## 2021-01-06 VITALS — BP 117/67 | HR 82 | Wt 197.0 lb

## 2021-01-06 DIAGNOSIS — Z363 Encounter for antenatal screening for malformations: Secondary | ICD-10-CM

## 2021-01-06 DIAGNOSIS — Z3A16 16 weeks gestation of pregnancy: Secondary | ICD-10-CM | POA: Diagnosis not present

## 2021-01-06 DIAGNOSIS — Z3480 Encounter for supervision of other normal pregnancy, unspecified trimester: Secondary | ICD-10-CM | POA: Diagnosis not present

## 2021-01-06 DIAGNOSIS — Z3482 Encounter for supervision of other normal pregnancy, second trimester: Secondary | ICD-10-CM

## 2021-01-06 NOTE — Patient Instructions (Addendum)
Katherine Fletcher, thank you for choosing our office today! We appreciate the opportunity to meet your healthcare needs. You may receive a short survey by mail, e-mail, or through [employer]. If you are happy with your care we would appreciate if you could take just a few minutes to complete the survey questions. We read all of your comments and take your feedback very seriously. Thank you again for choosing our office.  Center for [employer] Healthcare Team at [facility] at [facility] ([address] [place], [place] [address]) Entrance C, located off of [address] Free 24/7 valet parking   For Headaches:  Stay well hydrated, drink enough water so that your urine is clear, sometimes if you are dehydrated you can get headaches Eat small frequent meals and snacks, sometimes if you are hungry you can get headaches Sometimes you get headaches during pregnancy from the pregnancy hormones You can try tylenol (1-2 regular strength 325mg  or 1-2 extra strength 500mg ) as directed on the box. The least amount of medication that works is best.  Cool compresses (cool wet washcloth or ice pack) to area of head that is hurting You can also try drinking a caffeinated drink to see if this will help If not helping, try below:  For Prevention of Headaches/Migraines: CoQ10 100mg  three times daily Vitamin B2 400mg  daily Magnesium Oxide 400-600mg  daily  Foods to alleviate migraines:  1) dark leafy greens 2) avocado 3) tuna 4) salmon  5) beans and legumes  Foods to avoid: 1) Excessive (or irregular timing) coffee 3) aged cheeses 4) chocolate 5) citrus fruits 6) aspartame and other artifical sweeteners 7) yeast 8) MSG (in processed foods) 9) processed and cured meats 10) nuts and certain seeds 11) chicken livers and other organ meats 12) dairy products like buttermilk, sour cream, and yogurt 13) dried fruits like dates, figs, and raisins 14) garlic 15) onions 16) potato chips 17)  pickled foods like olives and sauerkraut 18) some fresh fruits like ripe banana, papaya, red plums, raspberries, kiwi, pineapple 19) tomato-based products  Recommend to keep a migraine diary: rate daily the severity of your headache (1-10) and what foods you eat that day to help determine patterns.   If You Get a Bad Headache/Migraine: Benadryl 25mg   Magnesium Oxide 1 large Gatorade 2 extra strength Tylenol (1,000mg  total) 1 cup coffee or Coke      If this doesn't help please call @ [phone]  Go to [email or web address] to register for FREE online childbirth classes  Call the office [phone]) or go to [facility] if: You begin to severe cramping Your water breaks.  Sometimes it is a big gush of fluid, sometimes it is just a trickle that keeps getting your panties wet or running down your legs You have vaginal bleeding.  It is normal to have a small amount of spotting if your cervix was checked.   [facility] Pediatricians/Family Doctors [facility]): [address] [phone], [phone]           [facility]: [address], [phone]                [facility]): [address], [phone] (call to ask if accepting patients) [facility] Department: [address], [place], [phone]    [facility] Pediatricians/Family Doctors [facility]): [phone], [phone] [facility]: [address] [place], [address] [facility]:  [place], [facility]): [phone] [facility]: [address] [phone]   [place]: [address] [facility]: [phone], [phone]  Home Blood Pressure Monitoring for Patients   Your  provider has recommended that you check your blood pressure (BP) at least once a week at home. If you do not have a blood pressure cuff at home, one will be provided for you. Contact your provider if you have not received your monitor within 1 week.   Helpful Tips for Accurate Home Blood Pressure Checks  Don't smoke, exercise, or drink caffeine 30 minutes before checking your BP Use the restroom before checking your BP (a full bladder can raise your pressure) Relax in a comfortable upright chair Feet on the ground Left arm resting comfortably on a flat surface at the level of your heart Legs uncrossed Back supported Sit quietly and don't talk Place the cuff on your bare arm Adjust snuggly, so that only two fingertips can fit between your skin and the top of the cuff Check 2 readings separated by at least one minute Keep a log of your BP readings For a visual, please reference this diagram: http://ccnc.care/bpdiagram  Provider Name: [facility]     Phone: [phone]  Zone 1: ALL CLEAR  Continue to monitor your symptoms:  BP reading is less than 140 (top number) or less than 90 (bottom number)  No right upper stomach pain No headaches or seeing spots No feeling nauseated or throwing up No swelling in face and hands  Zone 2: CAUTION Call your doctor's office for any of the following:  BP reading is greater than 140 (top number) or greater than 90 (bottom number)  Stomach pain under your ribs in the middle or right side Headaches or seeing spots Feeling nauseated or throwing up Swelling in face and hands  Zone 3: EMERGENCY  Seek immediate medical care if you have any of the following:  BP reading is greater than160 (top number) or greater than 110 (bottom number) Severe headaches not improving with Tylenol Serious difficulty catching your breath Any worsening symptoms from Zone 2     Second Trimester of Pregnancy The second trimester is from week 14 through week 27  (months 4 through 6). The second trimester is often a time when you feel your best. Your body has adjusted to being pregnant, and you begin to feel better physically. Usually, morning sickness has lessened or quit completely, you may have more energy, and you may have an increase in appetite. The second trimester is also a time when the fetus is growing rapidly. At the end of the sixth month, the fetus is about 9 inches long and weighs about 1 pounds. You will likely begin to feel the baby move (quickening) between 16 and 20 weeks of pregnancy. Body changes during your second trimester Your body continues to go through many changes during your second trimester. The changes vary from woman to woman. Your weight will continue to increase. You will notice your lower abdomen bulging out. You may begin to get stretch marks on your hips, abdomen, and breasts. You may develop headaches that can be relieved by medicines. The medicines should be approved by your health care provider. You may urinate more often because the fetus is pressing on your bladder. You may develop or continue  to have heartburn as a result of your pregnancy. You may develop constipation because certain hormones are causing the muscles that push waste through your intestines to slow down. You may develop hemorrhoids or swollen, bulging veins (varicose veins). You may have back pain. This is caused by: Weight gain. Pregnancy hormones that are relaxing the joints in your pelvis. A shift in weight and the muscles that support your balance. Your breasts will continue to grow and they will continue to become tender. Your gums may bleed and may be sensitive to brushing and flossing. Dark spots or blotches (chloasma, mask of pregnancy) may develop on your face. This will likely fade after the baby is born. A dark line from your belly button to the pubic area (linea nigra) may appear. This will likely fade after the baby is born. You may  have changes in your hair. These can include thickening of your hair, rapid growth, and changes in texture. Some women also have hair loss during or after pregnancy, or hair that feels dry or thin. Your hair will most likely return to normal after your baby is born.  What to expect at prenatal visits During a routine prenatal visit: You will be weighed to make sure you and the fetus are growing normally. Your blood pressure will be taken. Your abdomen will be measured to track your baby's growth. The fetal heartbeat will be listened to. Any test results from the previous visit will be discussed.  Your health care provider may ask you: How you are feeling. If you are feeling the baby move. If you have had any abnormal symptoms, such as leaking fluid, bleeding, severe headaches, or abdominal cramping. If you are using any tobacco products, including cigarettes, chewing tobacco, and electronic cigarettes. If you have any questions.  Other tests that may be performed during your second trimester include: Blood tests that check for: Low iron levels (anemia). High blood sugar that affects pregnant women (gestational diabetes) between 53 and 28 weeks. Rh antibodies. This is to check for a protein on red blood cells (Rh factor). Urine tests to check for infections, diabetes, or protein in the urine. An ultrasound to confirm the proper growth and development of the baby. An amniocentesis to check for possible genetic problems. Fetal screens for spina bifida and Down syndrome. HIV (human immunodeficiency virus) testing. Routine prenatal testing includes screening for HIV, unless you choose not to have this test.  Follow these instructions at home: Medicines Follow your health care provider's instructions regarding medicine use. Specific medicines may be either safe or unsafe to take during pregnancy. Take a prenatal vitamin that contains at least 600 micrograms (mcg) of folic acid. If you  develop constipation, try taking a stool softener if your health care provider approves. Eating and drinking Eat a balanced diet that includes fresh fruits and vegetables, whole grains, good sources of protein such as meat, eggs, or tofu, and low-fat dairy. Your health care provider will help you determine the amount of weight gain that is right for you. Avoid raw meat and uncooked cheese. These carry germs that can cause birth defects in the baby. If you have low calcium intake from food, talk to your health care provider about whether you should take a daily calcium supplement. Limit foods that are high in fat and processed sugars, such as fried and sweet foods. To prevent constipation: Drink enough fluid to keep your urine clear or pale yellow. Eat foods that are high in fiber, such as  fresh fruits and vegetables, whole grains, and beans. Activity Exercise only as directed by your health care provider. Most women can continue their usual exercise routine during pregnancy. Try to exercise for 30 minutes at least 5 days a week. Stop exercising if you experience uterine contractions. Avoid heavy lifting, wear low heel shoes, and practice good posture. A sexual relationship may be continued unless your health care provider directs you otherwise. Relieving pain and discomfort Wear a good support bra to prevent discomfort from breast tenderness. Take warm sitz baths to soothe any pain or discomfort caused by hemorrhoids. Use hemorrhoid cream if your health care provider approves. Rest with your legs elevated if you have leg cramps or low back pain. If you develop varicose veins, wear support hose. Elevate your feet for 15 minutes, 3-4 times a day. Limit salt in your diet. Prenatal Care Write down your questions. Take them to your prenatal visits. Keep all your prenatal visits as told by your health care provider. This is important. Safety Wear your seat belt at all times when driving. Make a list  of emergency phone numbers, including numbers for family, friends, the hospital, and police and fire departments. General instructions Ask your health care provider for a referral to a local prenatal education class. Begin classes no later than the beginning of month 6 of your pregnancy. Ask for help if you have counseling or nutritional needs during pregnancy. Your health care provider can offer advice or refer you to specialists for help with various needs. Do not use hot tubs, steam rooms, or saunas. Do not douche or use tampons or scented sanitary pads. Do not cross your legs for long periods of time. Avoid cat litter boxes and soil used by cats. These carry germs that can cause birth defects in the baby and possibly loss of the fetus by miscarriage or stillbirth. Avoid all smoking, herbs, alcohol, and unprescribed drugs. Chemicals in these products can affect the formation and growth of the baby. Do not use any products that contain nicotine or tobacco, such as cigarettes and e-cigarettes. If you need help quitting, ask your health care provider. Visit your dentist if you have not gone yet during your pregnancy. Use a soft toothbrush to brush your teeth and be gentle when you floss. Contact a health care provider if: You have dizziness. You have mild pelvic cramps, pelvic pressure, or nagging pain in the abdominal area. You have persistent nausea, vomiting, or diarrhea. You have a bad smelling vaginal discharge. You have pain when you urinate. Get help right away if: You have a fever. You are leaking fluid from your vagina. You have spotting or bleeding from your vagina. You have severe abdominal cramping or pain. You have rapid weight gain or weight loss. You have shortness of breath with chest pain. You notice sudden or extreme swelling of your face, hands, ankles, feet, or legs. You have not felt your baby move in over an hour. You have severe headaches that do not go away when you  take medicine. You have vision changes. Summary The second trimester is from week 14 through week 27 (months 4 through 6). It is also a time when the fetus is growing rapidly. Your body goes through many changes during pregnancy. The changes vary from woman to woman. Avoid all smoking, herbs, alcohol, and unprescribed drugs. These chemicals affect the formation and growth your baby. Do not use any tobacco products, such as cigarettes, chewing tobacco, and e-cigarettes. If you need help  quitting, ask your health care provider. Contact your health care provider if you have any questions. Keep all prenatal visits as told by your health care provider. This is important. This information is not intended to replace advice given to you by your health care provider. Make sure you discuss any questions you have with your health care provider. Document Released: 05/02/2001 Document Revised: 10/14/2015 Document Reviewed: 07/09/2012 Elsevier Interactive Patient Education  2017 [employer].

## 2021-01-06 NOTE — Progress Notes (Addendum)
LOW-RISK PREGNANCY VISIT Patient name: Katherine Fletcher MRN [id number]  Date of birth: 1985/11/17 Chief Complaint:   Routine Prenatal Visit  History of Present Illness:   Katherine Fletcher is a 35 y.o. G8P1001 female at [redacted]w[redacted]d with an Estimated Date of Delivery: 06/23/21 being seen today for ongoing management of a low-risk pregnancy.   Today she reports  occ pulling lower abdomen, Lt side . Headaches, apap doesn't help.  Contractions: Not present. Vag. Bleeding: None.  Movement: Present. denies leaking of fluid.  Depression screen [facility] 2/9 12/09/2020 10/25/2020 05/26/2020  Decreased Interest 1 1 3   Down, Depressed, Hopeless 1 1 3   PHQ - 2 Score 2 2 6   Altered sleeping 0 2 1  Tired, decreased energy 3 3 1   Change in appetite 1 1 2   Feeling bad or failure about yourself  2 2 3   Trouble concentrating 1 1 0  Moving slowly or fidgety/restless 0 0 3  Suicidal thoughts 0 0 0  PHQ-9 Score 9 11 16   Difficult doing work/chores - - Somewhat difficult     GAD 7 : Generalized Anxiety Score 12/09/2020 10/25/2020  Nervous, Anxious, on Edge 1 2  Control/stop worrying 1 2  Worry too much - different things 1 2  Trouble relaxing 2 2  Restless 0 1  Easily annoyed or irritable 3 3  Afraid - awful might happen 0 0  Total GAD 7 Score 8 12      Review of Systems:   Pertinent items are noted in HPI Denies abnormal vaginal discharge w/ itching/odor/irritation, headaches, visual changes, shortness of breath, chest pain, abdominal pain, severe nausea/vomiting, or problems with urination or bowel movements unless otherwise stated above. Pertinent History Reviewed:  Reviewed past medical,surgical, social, obstetrical and family history.  Reviewed problem list, medications and allergies. Physical Assessment:   Vitals:   01/06/21 1339  BP: 117/67  Pulse: 82  Weight: 197 lb (89.4 kg)  Body mass index is 29.09 kg/m.        Physical Examination:   General appearance: Well appearing, and in no distress  Mental  status: Alert, oriented to person, place, and time  Skin: Warm & dry  Cardiovascular: Normal heart rate noted  Respiratory: Normal respiratory effort, no distress  Abdomen: Soft, gravid, nontender  Pelvic: Cervical exam deferred         Extremities: Edema: None  Fetal Status: Fetal Heart Rate (bpm): 155   Movement: Present    Chaperone: N/A   No results found for this or any previous visit (from the past 24 hour(s)).  Assessment & Plan:  1) Low-risk pregnancy G2P1001 at [redacted]w[redacted]d with an Estimated Date of Delivery: 06/23/21   2) Prev c/s, wants TOLAC  3) PTSD/bipolar/anx/dep> no meds, is on waiting list for therapist w/ [employer] program  4) Headaches> gave printed prevention/relief measures    Meds: No orders of the defined types were placed in this encounter.  Labs/procedures today: 2nd IT  Plan:  Continue routine obstetrical care  Next visit: prefers will be in person for anatomy u/s     Reviewed: Preterm labor symptoms and general obstetric precautions including but not limited to vaginal bleeding, contractions, leaking of fluid and fetal movement were reviewed in detail with the patient.  All questions were answered. Does have home bp cuff. Office bp cuff given: not applicable. Check bp weekly, let know if consistently >140 and/or >90.  Follow-up: Return in about 3 weeks (around 01/27/2021) for LROB, 12/11/2020, CNM, in person.  Future Appointments  Date Time Provider Department Center  01/28/2021 10:30 AM [facility] [place] [facility] None  01/28/2021 11:30 AM [place], Amaryllis Dyke, MD [facility]    Orders Placed This Encounter  Procedures   US OB Comp + 14 Wk   INTEGRATED 2   Cheral Marker CNM, [facility] 01/06/2021 2:06 PM

## 2021-01-11 IMAGING — DX DG WRIST COMPLETE 3+V*L*
4 series · 4 of 4 positions shown · non-contrast
Comparison: None.

CLINICAL DATA: Left wrist pain for several months. No known injury.

EXAM:
LEFT WRIST - COMPLETE 3+ VIEW

[wrist pa (1 of 2)]
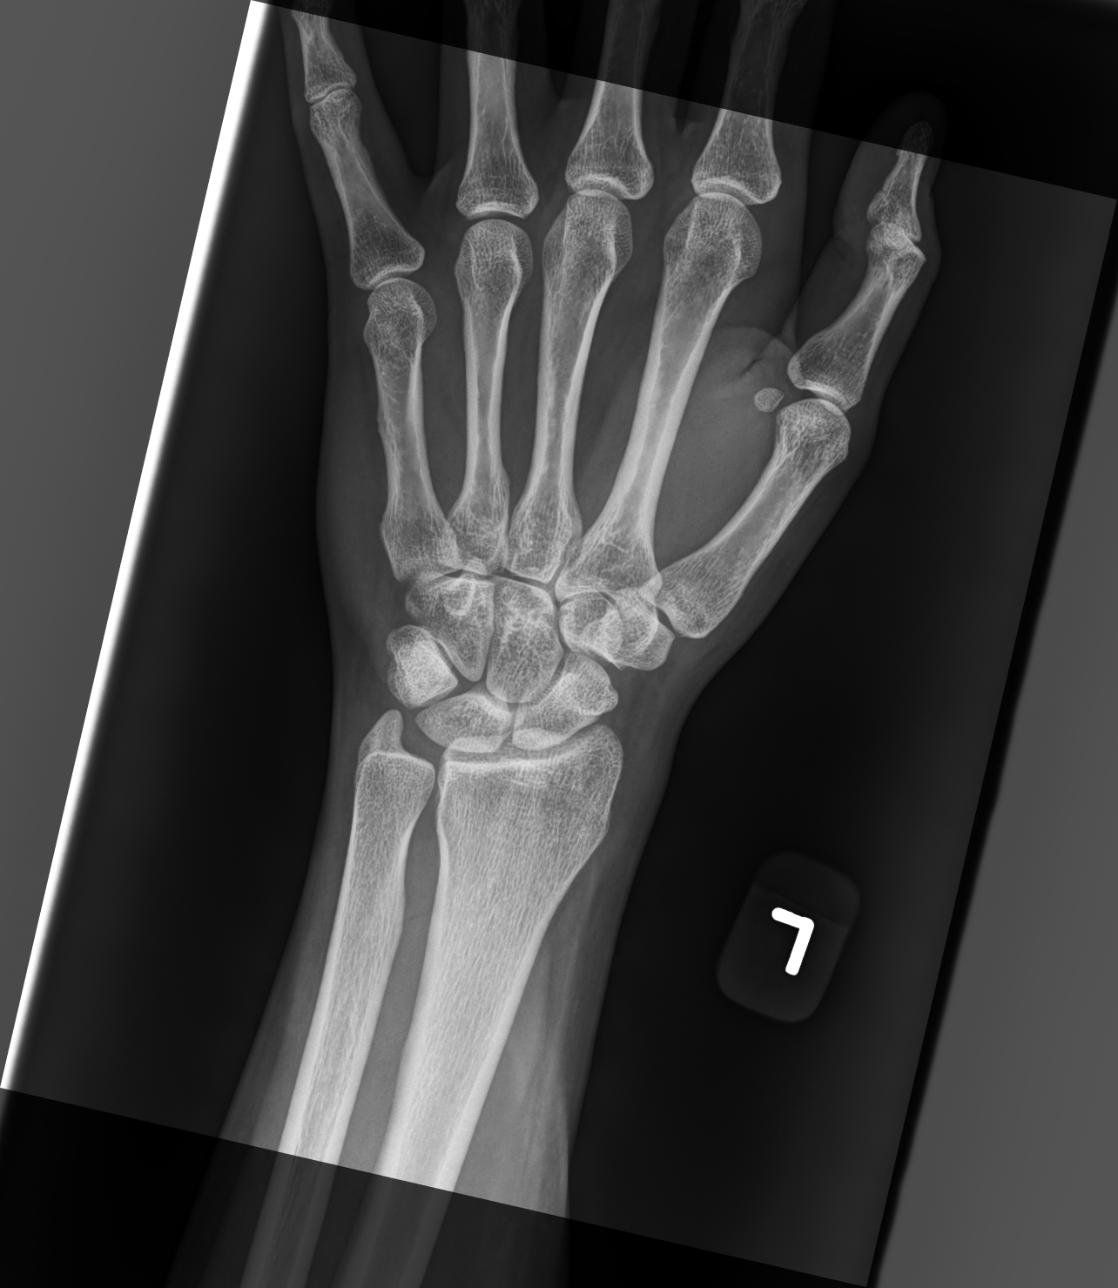

[wrist mlo]
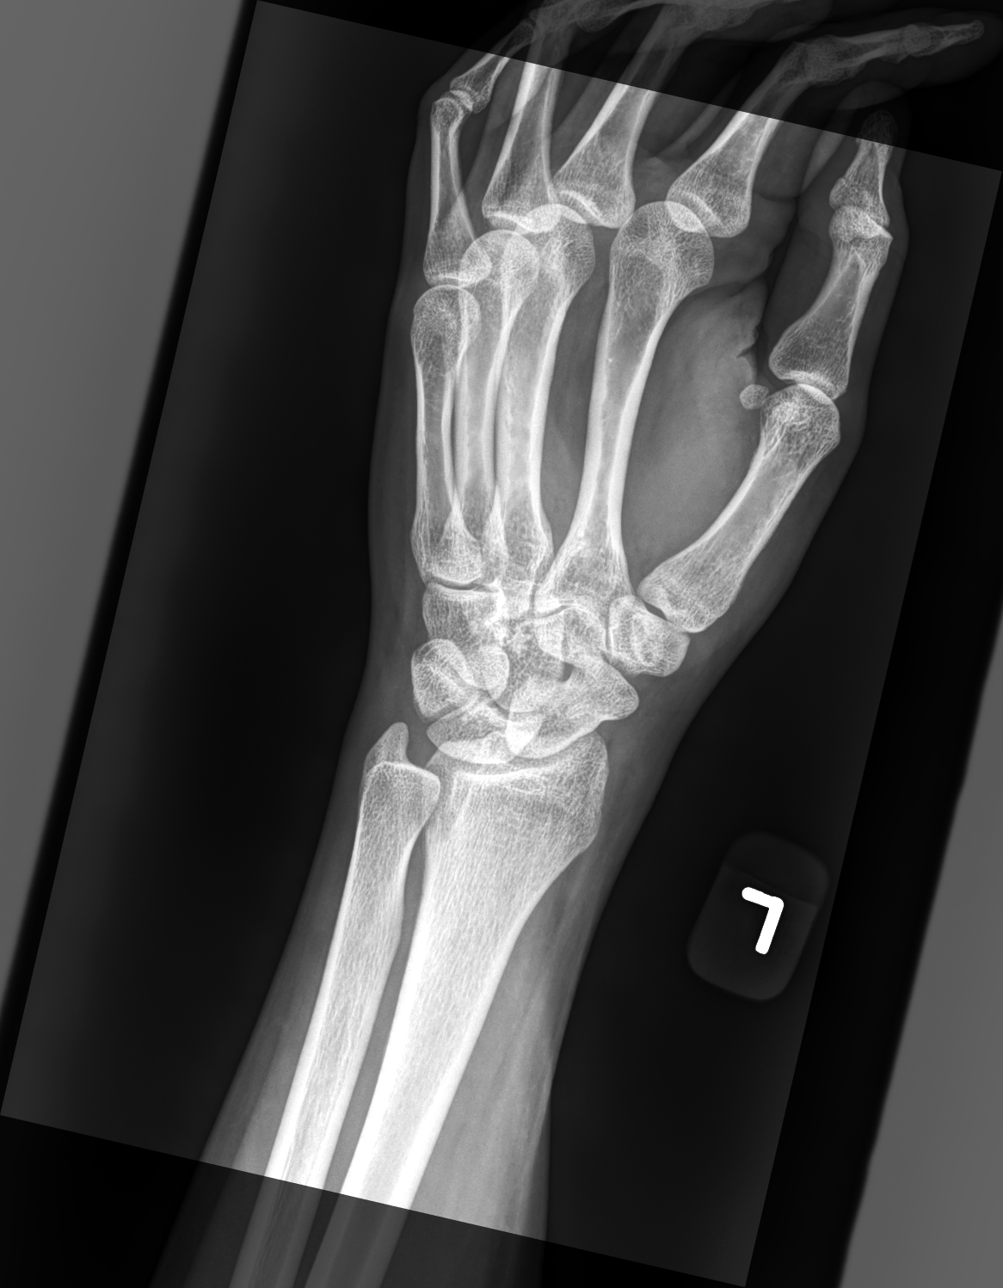

[wrist lat]
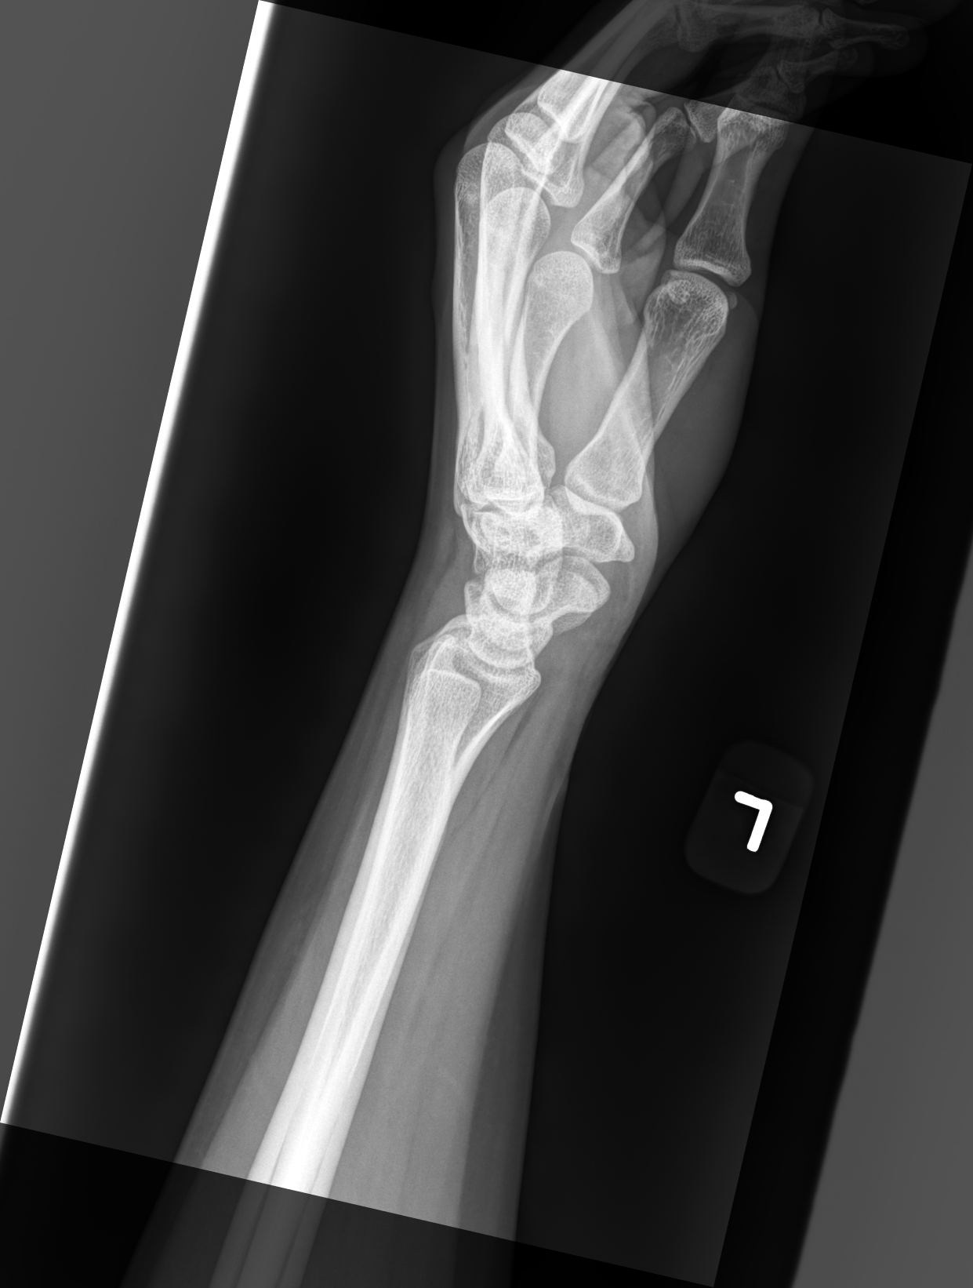

[wrist pa (2 of 2)]
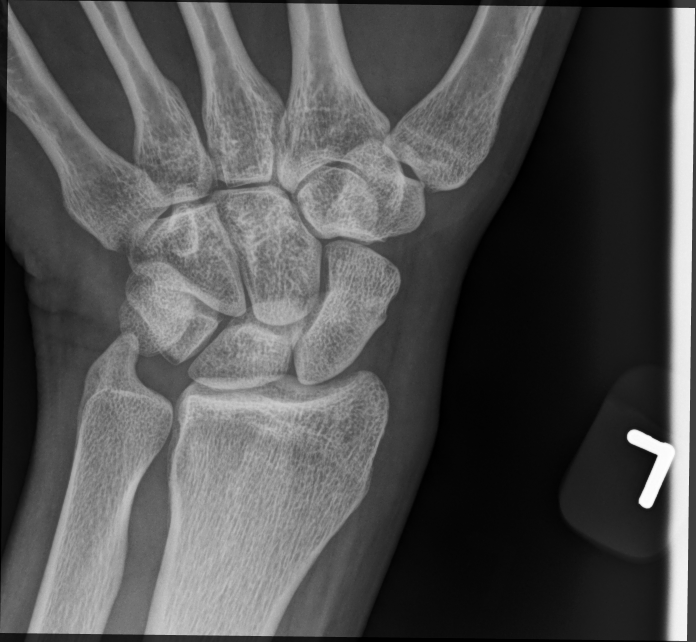

[4 of 4 positions shown; findings below may reference images not displayed]

FINDINGS: There is no evidence of fracture or dislocation. There is no
evidence of arthropathy or other focal bone abnormality. Soft
tissues are unremarkable.
IMPRESSION: Normal exam.

## 2021-01-12 LAB — INTEGRATED 2
AFP MoM: 0.65
Alpha-Fetoprotein: 21.9 ng/mL
Crown Rump Length: 58.7 mm
DIA MoM: 0.45
DIA Value: 69 pg/mL
Estriol, Unconjugated: 0.81 ng/mL
Gest. Age on Collection Date: 12.1 weeks
Gestational Age: 16.1 weeks
Maternal Age at EDD: 35.5 yr
Nuchal Translucency (NT): 1.4 mm
Nuchal Translucency MoM: 1.07
Number of Fetuses: 1
PAPP-A MoM: 1.61
PAPP-A Value: 1008.3 ng/mL
Test Results:: NEGATIVE
Weight: 150 [lb_av]
Weight: 195 [lb_av]
hCG MoM: 1.22
hCG Value: 43.5 IU/mL
uE3 MoM: 0.8

## 2021-01-28 ENCOUNTER — Encounter: Payer: Self-pay | Admitting: Obstetrics & Gynecology

## 2021-01-28 ENCOUNTER — Ambulatory Visit (INDEPENDENT_AMBULATORY_CARE_PROVIDER_SITE_OTHER): Payer: Medicaid Other

## 2021-01-28 ENCOUNTER — Other Ambulatory Visit: Payer: Self-pay

## 2021-01-28 ENCOUNTER — Ambulatory Visit (INDEPENDENT_AMBULATORY_CARE_PROVIDER_SITE_OTHER): Payer: Medicaid Other | Admitting: Obstetrics & Gynecology

## 2021-01-28 VITALS — BP 102/64 | HR 84 | Wt 202.0 lb

## 2021-01-28 DIAGNOSIS — Z363 Encounter for antenatal screening for malformations: Secondary | ICD-10-CM | POA: Diagnosis not present

## 2021-01-28 DIAGNOSIS — Z3A19 19 weeks gestation of pregnancy: Secondary | ICD-10-CM | POA: Diagnosis not present

## 2021-01-28 DIAGNOSIS — Z3482 Encounter for supervision of other normal pregnancy, second trimester: Secondary | ICD-10-CM

## 2021-01-28 DIAGNOSIS — Z3480 Encounter for supervision of other normal pregnancy, unspecified trimester: Secondary | ICD-10-CM

## 2021-01-28 NOTE — Progress Notes (Signed)
   LOW-RISK PREGNANCY VISIT Patient name: Katherine Fletcher MRN [id number]  Date of birth: 1985/10/15 Chief Complaint:   Routine Prenatal Visit (Anatomy scan/)  History of Present Illness:   Katherine Fletcher is a 35 y.o. G17P1001 female at [redacted]w[redacted]d with an Estimated Date of Delivery: 06/23/21 being seen today for ongoing management of a low-risk pregnancy.  Depression screen [facility] 2/9 12/09/2020 10/25/2020 05/26/2020  Decreased Interest 1 1 3   Down, Depressed, Hopeless 1 1 3   PHQ - 2 Score 2 2 6   Altered sleeping 0 2 1  Tired, decreased energy 3 3 1   Change in appetite 1 1 2   Feeling bad or failure about yourself  2 2 3   Trouble concentrating 1 1 0  Moving slowly or fidgety/restless 0 0 3  Suicidal thoughts 0 0 0  PHQ-9 Score 9 11 16   Difficult doing work/chores - - Somewhat difficult    Today she reports no complaints. Contractions: Not present. Vag. Bleeding: None.  Movement: Present. denies leaking of fluid. Review of Systems:   Pertinent items are noted in HPI Denies abnormal vaginal discharge w/ itching/odor/irritation, headaches, visual changes, shortness of breath, chest pain, abdominal pain, severe nausea/vomiting, or problems with urination or bowel movements unless otherwise stated above. Pertinent History Reviewed:  Reviewed past medical,surgical, social, obstetrical and family history.  Reviewed problem list, medications and allergies. Physical Assessment:   Vitals:   01/28/21 1134  BP: 102/64  Pulse: 84  Weight: 202 lb (91.6 kg)  Body mass index is 29.83 kg/m.        Physical Examination:   General appearance: Well appearing, and in no distress  Mental status: Alert, oriented to person, place, and time  Skin: Warm & dry  Cardiovascular: Normal heart rate noted  Respiratory: Normal respiratory effort, no distress  Abdomen: Soft, gravid, nontender  Pelvic: Cervical exam deferred         Extremities: Edema: None  Fetal Status:     Movement: Present    Chaperone: n/a    No  results found for this or any previous visit (from the past 24 hour(s)).  Assessment & Plan:  1) Low-risk pregnancy G2P1001 at [redacted]w[redacted]d with an Estimated Date of Delivery: 06/23/21   2) PTSD, going to begin OP therapy   Meds: No orders of the defined types were placed in this encounter.  Labs/procedures today:   Plan:  Continue routine obstetrical care  Next visit: prefers in person    Reviewed: Preterm labor symptoms and general obstetric precautions including but not limited to vaginal bleeding, contractions, leaking of fluid and fetal movement were reviewed in detail with the patient.  All questions were answered. Has home bp cuff. Rx faxed to . Check bp weekly, let know if >140/90.   Follow-up: Return in about 4 weeks (around 02/25/2021) for LROB.  No orders of the defined types were placed in this encounter.   , MD 01/28/2021 11:59 AM

## 2021-01-28 NOTE — Progress Notes (Signed)
[place] 19+1 wks,breech,posterior placenta gr 0,normal ovaries,cx 3.6 cm,svp of fluid 4.2 cm,fhr 164 bpm,EFW 279 g 47%,anatomy complete,no obvious abnormalities

## 2021-02-08 DIAGNOSIS — F603 Borderline personality disorder: Secondary | ICD-10-CM | POA: Diagnosis not present

## 2021-02-08 DIAGNOSIS — F4312 Post-traumatic stress disorder, chronic: Secondary | ICD-10-CM | POA: Diagnosis not present

## 2021-02-15 DIAGNOSIS — F603 Borderline personality disorder: Secondary | ICD-10-CM | POA: Diagnosis not present

## 2021-02-15 DIAGNOSIS — F4310 Post-traumatic stress disorder, unspecified: Secondary | ICD-10-CM | POA: Diagnosis not present

## 2021-02-21 DIAGNOSIS — F4310 Post-traumatic stress disorder, unspecified: Secondary | ICD-10-CM | POA: Diagnosis not present

## 2021-02-21 DIAGNOSIS — F603 Borderline personality disorder: Secondary | ICD-10-CM | POA: Diagnosis not present

## 2021-02-28 ENCOUNTER — Other Ambulatory Visit: Payer: Self-pay

## 2021-02-28 ENCOUNTER — Ambulatory Visit (INDEPENDENT_AMBULATORY_CARE_PROVIDER_SITE_OTHER): Payer: Medicaid Other | Admitting: Women's Health

## 2021-02-28 ENCOUNTER — Encounter: Payer: Self-pay | Admitting: Women's Health

## 2021-02-28 VITALS — BP 117/67 | HR 83 | Wt 206.0 lb

## 2021-02-28 DIAGNOSIS — Z3480 Encounter for supervision of other normal pregnancy, unspecified trimester: Secondary | ICD-10-CM

## 2021-02-28 DIAGNOSIS — M25559 Pain in unspecified hip: Secondary | ICD-10-CM

## 2021-02-28 DIAGNOSIS — Z3482 Encounter for supervision of other normal pregnancy, second trimester: Secondary | ICD-10-CM

## 2021-02-28 DIAGNOSIS — F418 Other specified anxiety disorders: Secondary | ICD-10-CM

## 2021-02-28 NOTE — Progress Notes (Signed)
LOW-RISK PREGNANCY VISIT Patient name: Katherine Fletcher MRN [id number]  Date of birth: Jan 20, 1986 Chief Complaint:   Routine Prenatal Visit  History of Present Illness:   Katherine Fletcher is a 35 y.o. G90P1001 female at [redacted]w[redacted]d with an Estimated Date of Delivery: 06/23/21 being seen today for ongoing management of a low-risk pregnancy.   Today she reports  seeing therapist weekly w/ [facility], going well. Some round ligament pain, occ Braxton Hicks . Hip hypermobility, pelvic pains- has been trying to do some pelvic floor PT herself at home (found things online), would like referral to PT. Contractions: Irritability. Vag. Bleeding: None.  Movement: Present. denies leaking of fluid.  Depression screen [facility] 2/9 12/09/2020 10/25/2020 05/26/2020  Decreased Interest 1 1 3   Down, Depressed, Hopeless 1 1 3   PHQ - 2 Score 2 2 6   Altered sleeping 0 2 1  Tired, decreased energy 3 3 1   Change in appetite 1 1 2   Feeling bad or failure about yourself  2 2 3   Trouble concentrating 1 1 0  Moving slowly or fidgety/restless 0 0 3  Suicidal thoughts 0 0 0  PHQ-9 Score 9 11 16   Difficult doing work/chores - - Somewhat difficult     GAD 7 : Generalized Anxiety Score 12/09/2020 10/25/2020  Nervous, Anxious, on Edge 1 2  Control/stop worrying 1 2  Worry too much - different things 1 2  Trouble relaxing 2 2  Restless 0 1  Easily annoyed or irritable 3 3  Afraid - awful might happen 0 0  Total GAD 7 Score 8 12      Review of Systems:   Pertinent items are noted in HPI Denies abnormal vaginal discharge w/ itching/odor/irritation, headaches, visual changes, shortness of breath, chest pain, abdominal pain, severe nausea/vomiting, or problems with urination or bowel movements unless otherwise stated above. Pertinent History Reviewed:  Reviewed past medical,surgical, social, obstetrical and family history.  Reviewed problem list, medications and allergies. Physical Assessment:   Vitals:   02/28/21 0959  BP:  117/67  Pulse: 83  Weight: 206 lb (93.4 kg)  Body mass index is 30.42 kg/m.        Physical Examination:   General appearance: Well appearing, and in no distress  Mental status: Alert, oriented to person, place, and time  Skin: Warm & dry  Cardiovascular: Normal heart rate noted  Respiratory: Normal respiratory effort, no distress  Abdomen: Soft, gravid, nontender  Pelvic: Cervical exam deferred         Extremities: Edema: None  Fetal Status: Fetal Heart Rate (bpm): 147 Fundal Height: 23 cm Movement: Present    Chaperone: N/A   No results found for this or any previous visit (from the past 24 hour(s)).  Assessment & Plan:  1) Low-risk pregnancy G2P1001 at [redacted]w[redacted]d with an Estimated Date of Delivery: 06/23/21   2) Prev c/s, wants TOLAC  3) PTSD/bipolar/dep/anx/BPD>no meds, seeing therapist weekly w/ [facility], going well  4) Hip hypermobility, pelvic pain> referral to pelvic floor PT   Meds: No orders of the defined types were placed in this encounter.  Labs/procedures today: none  Plan:  Continue routine obstetrical care  Next visit: prefers will be in person for pn2     Reviewed: Preterm labor symptoms and general obstetric precautions including but not limited to vaginal bleeding, contractions, leaking of fluid and fetal movement were reviewed in detail with the patient.  All questions were answered. Does have home bp cuff. Office bp cuff given: not applicable.  Check bp weekly, let us know if consistently >140 and/or >90.  Follow-up: Return in about 4 weeks (around 03/28/2021) for LROB, PN2, in person, MD only (to sign vbac consent).  Future Appointments  Date Time Provider Department Center  03/28/2021  8:30 AM [facility]  03/28/2021  8:50 AM Eure, Amaryllis Dyke, MD [facility]    Orders Placed This Encounter  Procedures   Ambulatory referral to Physical Therapy    Cheral Marker CNM, [facility] 02/28/2021 10:43 AM

## 2021-02-28 NOTE — Patient Instructions (Signed)
Katherine Fletcher, thank you for choosing our office today! We appreciate the opportunity to meet your healthcare needs. You may receive a short survey by mail, e-mail, or through [employer]. If you are happy with your care we would appreciate if you could take just a few minutes to complete the survey questions. We read all of your comments and take your feedback very seriously. Thank you again for choosing our office.  Center for [employer] Team at [facility]  [facility] at [facility] ([address] [place], [place] [address]) Entrance C, located off of [address] Free 24/7 valet parking   You will have your sugar test next visit.  Please do not eat or drink anything after midnight the night before you come, not even water.  You will be here for at least two hours.  Please make an appointment online for the bloodwork at [email or web address] for 8:00am (or as close to this as possible). Make sure you select the [facility] service center.   CLASSES: Go to [email or web address] to register for classes (childbirth, breastfeeding, waterbirth, infant CPR, daddy bootcamp, etc.)  Call the office [phone]) or go to [facility] if: You begin to have strong, frequent contractions Your water breaks.  Sometimes it is a big gush of fluid, sometimes it is just a trickle that keeps getting your panties wet or running down your legs You have vaginal bleeding.  It is normal to have a small amount of spotting if your cervix was checked.  You don't feel your baby moving like normal.  If you don't, get you something to eat and drink and lay down and focus on feeling your baby move.   If your baby is still not moving like normal, you should call the office or go to [facility]  Call the office [phone]) or go to [facility] hospital for these signs of pre-eclampsia: Severe headache that does not go away with Tylenol Visual changes- seeing spots, double, blurred vision Pain under your right breast or upper  abdomen that does not go away with Tums or heartburn medicine Nausea and/or vomiting Severe swelling in your hands, feet, and face    [facility] Pediatricians/Family Doctors [facility]): [address] Colette Ribas, [phone]           [facility]: [address], [phone]                [facility]): [address], [phone] (call to ask if accepting patients) [facility] Department: [address], [place], [phone]    [facility] Pediatricians/Family Doctors [facility]): [address], [phone] [facility]: [address] [place], [phone] [facility]: [address], [phone]  [facility] Doctors  [facility]): [phone] [facility]: [address] [phone]   [facility]: [address] [phone]  [facility] Doctors  [facility]: [phone], [phone]  Home Blood Pressure Monitoring for Patients   Your provider has recommended that you check your blood pressure (BP) at least once a week at home. If you do not have a blood pressure cuff at home, one will be provided for you. Contact your provider if you have not received your monitor within 1 week.   Helpful Tips for Accurate Home Blood Pressure Checks  Don't smoke, exercise, or drink  caffeine 30 minutes before checking your BP Use the restroom before checking your BP (a full bladder can raise your pressure) Relax in a comfortable upright chair Feet on the ground Left arm resting comfortably on a flat surface at the level of your heart Legs uncrossed Back supported Sit quietly and don't talk Place the cuff on your bare arm Adjust snuggly, so that only two fingertips can fit between your skin and the top of the cuff Check 2  readings separated by at least one minute Keep a log of your BP readings For a visual, please reference this diagram: http://ccnc.care/bpdiagram  Provider Name: [facility]     Phone: [phone]  Zone 1: ALL CLEAR  Continue to monitor your symptoms:  BP reading is less than 140 (top number) or less than 90 (bottom number)  No right upper stomach pain No headaches or seeing spots No feeling nauseated or throwing up No swelling in face and hands  Zone 2: CAUTION Call your doctor's office for any of the following:  BP reading is greater than 140 (top number) or greater than 90 (bottom number)  Stomach pain under your ribs in the middle or right side Headaches or seeing spots Feeling nauseated or throwing up Swelling in face and hands  Zone 3: EMERGENCY  Seek immediate medical care if you have any of the following:  BP reading is greater than160 (top number) or greater than 110 (bottom number) Severe headaches not improving with Tylenol Serious difficulty catching your breath Any worsening symptoms from Zone 2   Second Trimester of Pregnancy The second trimester is from week 13 through week 28, months 4 through 6. The second trimester is often a time when you feel your best. Your body has also adjusted to being pregnant, and you begin to feel better physically. Usually, morning sickness has lessened or quit completely, you may have more energy, and you may have an increase in appetite. The second trimester is also a time when the fetus is growing rapidly. At the end of the sixth month, the fetus is about 9 inches long and weighs about 1 pounds. You will likely begin to feel the baby move (quickening) between 18 and 20 weeks of the pregnancy. BODY CHANGES Your body goes through many changes during pregnancy. The changes vary from woman to woman.  Your weight will continue to increase. You will notice your lower abdomen bulging out. You may begin to get stretch marks on your  hips, abdomen, and breasts. You may develop headaches that can be relieved by medicines approved by your health care provider. You may urinate more often because the fetus is pressing on your bladder. You may develop or continue to have heartburn as a result of your pregnancy. You may develop constipation because certain hormones are causing the muscles that push waste through your intestines to slow down. You may develop hemorrhoids or swollen, bulging veins (varicose veins). You may have back pain because of the weight gain and pregnancy hormones relaxing your joints between the bones in your pelvis and as a result of a shift in weight and the muscles that support your balance. Your breasts will continue to grow and be tender. Your gums may bleed and may be sensitive to brushing and flossing. Dark spots or blotches (chloasma, mask of pregnancy) may develop on your face. This will likely fade after the baby is born. A dark line from your belly button to the pubic area (linea nigra) may appear. This  will likely fade after the baby is born. You may have changes in your hair. These can include thickening of your hair, rapid growth, and changes in texture. Some women also have hair loss during or after pregnancy, or hair that feels dry or thin. Your hair will most likely return to normal after your baby is born. WHAT TO EXPECT AT YOUR PRENATAL VISITS During a routine prenatal visit: You will be weighed to make sure you and the fetus are growing normally. Your blood pressure will be taken. Your abdomen will be measured to track your baby's growth. The fetal heartbeat will be listened to. Any test results from the previous visit will be discussed. Your health care provider may ask you: How you are feeling. If you are feeling the baby move. If you have had any abnormal symptoms, such as leaking fluid, bleeding, severe headaches, or abdominal cramping. If you have any questions. Other tests that may  be performed during your second trimester include: Blood tests that check for: Low iron levels (anemia). Gestational diabetes (between 24 and 28 weeks). Rh antibodies. Urine tests to check for infections, diabetes, or protein in the urine. An ultrasound to confirm the proper growth and development of the baby. An amniocentesis to check for possible genetic problems. Fetal screens for spina bifida and Down syndrome. HOME CARE INSTRUCTIONS  Avoid all smoking, herbs, alcohol, and unprescribed drugs. These chemicals affect the formation and growth of the baby. Follow your health care provider's instructions regarding medicine use. There are medicines that are either safe or unsafe to take during pregnancy. Exercise only as directed by your health care provider. Experiencing uterine cramps is a good sign to stop exercising. Continue to eat regular, healthy meals. Wear a good support bra for breast tenderness. Do not use hot tubs, steam rooms, or saunas. Wear your seat belt at all times when driving. Avoid raw meat, uncooked cheese, cat litter boxes, and soil used by cats. These carry germs that can cause birth defects in the baby. Take your prenatal vitamins. Try taking a stool softener (if your health care provider approves) if you develop constipation. Eat more high-fiber foods, such as fresh vegetables or fruit and whole grains. Drink plenty of fluids to keep your urine clear or pale yellow. Take warm sitz baths to soothe any pain or discomfort caused by hemorrhoids. Use hemorrhoid cream if your health care provider approves. If you develop varicose veins, wear support hose. Elevate your feet for 15 minutes, 3-4 times a day. Limit salt in your diet. Avoid heavy lifting, wear low heel shoes, and practice good posture. Rest with your legs elevated if you have leg cramps or low back pain. Visit your dentist if you have not gone yet during your pregnancy. Use a soft toothbrush to brush your teeth  and be gentle when you floss. A sexual relationship may be continued unless your health care provider directs you otherwise. Continue to go to all your prenatal visits as directed by your health care provider. SEEK MEDICAL CARE IF:  You have dizziness. You have mild pelvic cramps, pelvic pressure, or nagging pain in the abdominal area. You have persistent nausea, vomiting, or diarrhea. You have a bad smelling vaginal discharge. You have pain with urination. SEEK IMMEDIATE MEDICAL CARE IF:  You have a fever. You are leaking fluid from your vagina. You have spotting or bleeding from your vagina. You have severe abdominal cramping or pain. You have rapid weight gain or loss. You have shortness of   breath with chest pain. You notice sudden or extreme swelling of your face, hands, ankles, feet, or legs. You have not felt your baby move in over an hour. You have severe headaches that do not go away with medicine. You have vision changes. Document Released: 05/02/2001 Document Revised: 05/13/2013 Document Reviewed: 07/09/2012 [facility] Patient Information 2015 [place], [place]. This information is not intended to replace advice given to you by your health care provider. Make sure you discuss any questions you have with your health care provider.

## 2021-03-01 DIAGNOSIS — F4310 Post-traumatic stress disorder, unspecified: Secondary | ICD-10-CM | POA: Diagnosis not present

## 2021-03-01 DIAGNOSIS — F603 Borderline personality disorder: Secondary | ICD-10-CM | POA: Diagnosis not present

## 2021-03-08 DIAGNOSIS — F603 Borderline personality disorder: Secondary | ICD-10-CM | POA: Diagnosis not present

## 2021-03-08 DIAGNOSIS — F4310 Post-traumatic stress disorder, unspecified: Secondary | ICD-10-CM | POA: Diagnosis not present

## 2021-03-15 ENCOUNTER — Encounter (HOSPITAL_COMMUNITY): Payer: Self-pay | Admitting: Physical Therapy

## 2021-03-15 ENCOUNTER — Other Ambulatory Visit: Payer: Self-pay

## 2021-03-15 ENCOUNTER — Ambulatory Visit (HOSPITAL_COMMUNITY): Payer: Medicaid Other | Attending: Women's Health | Admitting: Physical Therapy

## 2021-03-15 DIAGNOSIS — M25551 Pain in right hip: Secondary | ICD-10-CM | POA: Insufficient documentation

## 2021-03-15 DIAGNOSIS — Z3482 Encounter for supervision of other normal pregnancy, second trimester: Secondary | ICD-10-CM | POA: Insufficient documentation

## 2021-03-15 DIAGNOSIS — M25559 Pain in unspecified hip: Secondary | ICD-10-CM | POA: Insufficient documentation

## 2021-03-15 DIAGNOSIS — F603 Borderline personality disorder: Secondary | ICD-10-CM | POA: Diagnosis not present

## 2021-03-15 DIAGNOSIS — F4310 Post-traumatic stress disorder, unspecified: Secondary | ICD-10-CM | POA: Diagnosis not present

## 2021-03-15 DIAGNOSIS — M545 Low back pain, unspecified: Secondary | ICD-10-CM | POA: Insufficient documentation

## 2021-03-15 NOTE — Therapy (Signed)
[facility] [address] [place], [place], [address] Phone: [phone]   Fax:  [phone]  Physical Therapy Evaluation  Patient Details  Name: Katherine Fletcher MRN: [id number] Date of Birth: 1986/02/18 Referring Provider (PT): Shawna Clamp CNM   Encounter Date: 03/15/2021   PT End of Session - 03/15/21 1517     Visit Number 1    Number of Visits 4    Date for PT Re-Evaluation 04/12/21    Authorization Type Medicaid [employer]    Authorization Time Period check auth    PT Start Time 1430    PT Stop Time 1515    PT Time Calculation (min) 45 min    Activity Tolerance Patient tolerated treatment well    Behavior During Therapy [facility] for tasks assessed/performed             Past Medical History:  Diagnosis Date   Allergy    Anemia    Anxiety    Phreesia 05/23/2020   Cesarean delivery delivered 11/21/2019   Depression    Depression    Phreesia 05/23/2020   History of cesarean section 11/04/2019   Migraine    PTSD (post-traumatic stress disorder)    Vaginal Pap smear, abnormal     Past Surgical History:  Procedure Laterality Date   BREAST REDUCTION SURGERY     BREAST SURGERY N/A    Phreesia 05/23/2020   CESAREAN SECTION     CESAREAN SECTION N/A    Phreesia 05/23/2020   COSMETIC SURGERY N/A    Phreesia 05/23/2020   TONSILLECTOMY     WISDOM TOOTH EXTRACTION      There were no vitals filed for this visit.    Subjective Assessment - 03/15/21 1442     Subjective Patient presents to therapy with complaint of bilateral hip instability. States this began since becoming pregnant, currently 25 weeks. Also noting some LT ankle discomfort. Has not had these issues with previous pregnancy. No notable balance issues or falls. Would like some exercise for present issues.    Limitations Standing;Walking;Lifting    Patient Stated Goals Increase strength and stability    Currently in Pain? Yes    Pain Score 2     Pain  Location Back    Pain Orientation Lower;Posterior    Pain Descriptors / Indicators Aching;Discomfort    Pain Type Acute pain    Pain Onset More than a month ago    Pain Frequency Intermittent    Aggravating Factors  Laying on back, twisting    Pain Relieving Factors heat    Effect of Pain on Daily Activities Limits                [facility] PT Assessment - 03/15/21 0001       Assessment   Medical Diagnosis Bilateral hip instability    Referring Provider (PT) Shawna Clamp CNM    Prior Therapy Yes      Precautions   Precautions Other (comment)   pregnancy     Restrictions   Weight Bearing Restrictions No      Balance Screen   Has the patient fallen in the past 6 months No      Home Environment   Living Environment Private residence      Prior Function   Level of Independence Independent      Cognition   Overall Cognitive Status Within Functional Limits for tasks assessed      Observation/Other Assessments   Observations decreased TA activation,  spinal flexion during functional squat      ROM / Strength   AROM / PROM / Strength AROM;Strength      AROM   Overall AROM Comments Lumbar AROM WFL      Strength   Strength Assessment Site Hip;Knee    Right/Left Hip Left;Right    Right Hip Flexion 4+/5    Right Hip Extension 4+/5    Right Hip ABduction 4+/5    Left Hip Flexion 4+/5    Left Hip Extension 4+/5    Left Hip ABduction 4+/5    Right/Left Knee Right;Left    Right Knee Extension 4+/5    Left Knee Extension 5/5      Special Tests   Other special tests (-) sacral compression                        Objective measurements completed on examination: See above findings.       [facility] Adult PT Treatment/Exercise - 03/15/21 0001       Exercises   Exercises Lumbar      Lumbar Exercises: Seated   Sit to Stand 5 reps   with cues for TA activation     Lumbar Exercises: Supine   Ab Set 5 reps                     PT Education  - 03/15/21 1445     Education Details on evaluation findings, POC and HEP    Person(s) Educated Patient    Methods Explanation;Handout    Comprehension Verbalized understanding              PT Short Term Goals - 03/15/21 1535       PT SHORT TERM GOAL #1   Title Patient will be independent with initial HEP and self-management strategies to improve functional outcomes    Time 2    Period Weeks    Status New    Target Date 03/29/21               PT Long Term Goals - 03/15/21 1536       PT LONG TERM GOAL #1   Title Patient will report at least 50% overall improvement in subjective complaint to indicate improvement in ability to perform ADLs.    Time 4    Period Weeks    Status New    Target Date 04/12/21      PT LONG TERM GOAL #2   Title Patient will be independent with advance HEP and self-management strategies to improve functional outcomes    Time 4    Period Weeks    Status New    Target Date 04/12/21                    Plan - 03/15/21 1537     Clinical Impression Statement Patient is a 35 y.o. NB who presents to physical therapy with complaint of bilateral hip instability/ pain. Patient demonstrates decreased strength, reduced perceived functional ability and altered biomechanics which are likely contributing to symptoms of pain and are negatively impacting patient ability to perform ADLs and functional mobility tasks. Patient will benefit from skilled physical therapy services to address these deficits to reduce pain, improve level of function with ADLs and functional mobility tasks.    Examination-Activity Limitations Lift;Stand;Locomotion Level;Transfers;Squat    Examination-Participation Restrictions Other;Community Activity;Cleaning    Stability/Clinical Decision Making Stable/Uncomplicated    Clinical Decision Making Low  Rehab Potential Good    PT Frequency 1x / week    PT Duration 4 weeks    PT Treatment/Interventions ADLs/Self Care Home  Management;Biofeedback;Moist Heat;Stair training;Balance training;Neuromuscular re-education;Passive range of motion;Scar mobilization;Compression bandaging;Orthotic Fit/Training;Taping;Vasopneumatic Device;Joint Manipulations;Visual/perceptual remediation/compensation;Dry needling;Manual techniques;Functional mobility training;Therapeutic activities;Patient/family education;Traction;Cryotherapy;Ultrasound;Parrafin;Fluidtherapy;Manual lymph drainage;Energy conservation;Splinting;Spinal Manipulations    PT Next Visit Plan Progress core and functional hip and core strength/ stabilization as tolerated. Ab march, deadbug, ball marching, band work on ball, band clamshell    PT Home Exercise Plan Eval: ab brace, kegel, cat cow with ab brace, pelvic tilt on swiss ball    Consulted and Agree with Plan of Care Patient             Patient will benefit from skilled therapeutic intervention in order to improve the following deficits and impairments:  Pain, Improper body mechanics, Impaired perceived functional ability, Hypermobility, Decreased strength, Decreased activity tolerance  Visit Diagnosis: Low back pain, unspecified back pain laterality, unspecified chronicity, unspecified whether sciatica present  Pain in right hip     Problem List Patient Active Problem List   Diagnosis Date Noted   Encounter for supervision of normal pregnancy, antepartum 12/09/2020   Depression/anxiety/PTSD/Bipolar/BPD 12/09/2020   History of cesarean section 11/04/2019   Mood disorder (HCC) 09/29/2019   Vegetarian diet 06/12/2019   Allergic rhinitis due to animal hair and dander 02/27/2018   Chronic sinusitis 02/05/2018   3:51 PM, 03/15/21 Georges Lynch PT DPT  Physical Therapist with [facility]  [facility]  [phone]  [facility] [address] [place], [place], [address] Phone: [phone]   Fax:  [phone]  Name: Katherine Fletcher MRN:  [id number] Date of Birth: February 25, 1986

## 2021-03-15 NOTE — Patient Instructions (Signed)
Access Code: [email or web address] URL: [email or web address] Date: 03/15/2021 Prepared by: Georges Lynch  Exercises Supine Pelvic Floor Contraction - 2-3 x daily - 7 x weekly - 2 sets - 10 reps - 5 second hold Supine Transversus Abdominis Bracing - Hands on Stomach - 2-3 x daily - 7 x weekly - 2 sets - 10 reps - 5 second hold Quadruped Transversus Abdominis Bracing - 2-3 x daily - 7 x weekly - 2 sets - 10 reps - 5 second hold Pelvic Tilt on Swiss Ball - 2-3 x daily - 7 x weekly - 2 sets - 10 reps Squat with Chair Touch - 2-3 x daily - 7 x weekly - 2 sets - 10 reps

## 2021-03-22 ENCOUNTER — Other Ambulatory Visit: Payer: Self-pay

## 2021-03-22 ENCOUNTER — Ambulatory Visit (HOSPITAL_COMMUNITY): Payer: Medicaid Other | Attending: Women's Health | Admitting: Physical Therapy

## 2021-03-22 DIAGNOSIS — M25551 Pain in right hip: Secondary | ICD-10-CM | POA: Diagnosis not present

## 2021-03-22 DIAGNOSIS — F603 Borderline personality disorder: Secondary | ICD-10-CM | POA: Diagnosis not present

## 2021-03-22 DIAGNOSIS — M545 Low back pain, unspecified: Secondary | ICD-10-CM | POA: Diagnosis not present

## 2021-03-22 DIAGNOSIS — F4310 Post-traumatic stress disorder, unspecified: Secondary | ICD-10-CM | POA: Diagnosis not present

## 2021-03-22 NOTE — Patient Instructions (Signed)
Access Code: [email or web address] URL: [email or web address] Date: 03/22/2021 Prepared by: Georges Lynch  Exercises Swiss Ball March - 2 x daily - 7 x weekly - 2 sets - 10 reps Pelvic Tilt on Swiss Ball - 2 x daily - 7 x weekly - 2 sets - 10 reps Seated Swiss Ball Lateral Pelvic Tilts - 2 x daily - 7 x weekly - 2 sets - 10 reps Pelvic Circles on Swiss Ball - 2 x daily - 7 x weekly - 2 sets - 10 reps Beginner Front Arm Support - 2 x daily - 7 x weekly - 2 sets - 10 reps

## 2021-03-22 NOTE — Therapy (Signed)
[facility] [address] [place], [place], [address] Phone: [phone]   Fax:  [phone]  Physical Therapy Treatment  Patient Details  Name: Nivea Wojdyla MRN: [id number] Date of Birth: 04-Jan-1986 Referring Provider (PT): Shawna Clamp CNM   Encounter Date: 03/22/2021   PT End of Session - 03/22/21 1523     Visit Number 2    Number of Visits 4    Date for PT Re-Evaluation 04/12/21    Authorization Type Medicaid [employer]    Authorization Time Period Pending    PT Start Time 1520    PT Stop Time 1600    PT Time Calculation (min) 40 min    Activity Tolerance Patient tolerated treatment well    Behavior During Therapy [facility] for tasks assessed/performed             Past Medical History:  Diagnosis Date   Allergy    Anemia    Anxiety    Phreesia 05/23/2020   Cesarean delivery delivered 11/21/2019   Depression    Depression    Phreesia 05/23/2020   History of cesarean section 11/04/2019   Migraine    PTSD (post-traumatic stress disorder)    Vaginal Pap smear, abnormal     Past Surgical History:  Procedure Laterality Date   BREAST REDUCTION SURGERY     BREAST SURGERY N/A    Phreesia 05/23/2020   CESAREAN SECTION     CESAREAN SECTION N/A    Phreesia 05/23/2020   COSMETIC SURGERY N/A    Phreesia 05/23/2020   TONSILLECTOMY     WISDOM TOOTH EXTRACTION      There were no vitals filed for this visit.   Subjective Assessment - 03/22/21 1523     Subjective Patient reports compliance with HEP. Feels significantly better since last week. Feels exercises are helping.    Limitations Standing;Walking;Lifting    Patient Stated Goals Increase strength and stability    Currently in Pain? No/denies    Pain Onset More than a month ago                               [facility] Adult PT Treatment/Exercise - 03/22/21 0001       Lumbar Exercises: Seated   Sit to Stand 20 reps   2 x 10   Other Seated Lumbar  Exercises A/P and lateral pelvic tilts on stability ball x10 each, marching on stability ball x 10      Lumbar Exercises: Supine   Other Supine Lumbar Exercises ab brace with kegels 10x 5"      Lumbar Exercises: Quadruped   Other Quadruped Lumbar Exercises cat/ cow with ab brace x10    Other Quadruped Lumbar Exercises hip extension x 10 each,                       PT Short Term Goals - 03/15/21 1535       PT SHORT TERM GOAL #1   Title Patient will be independent with initial HEP and self-management strategies to improve functional outcomes    Time 2    Period Weeks    Status New    Target Date 03/29/21               PT Long Term Goals - 03/15/21 1536       PT LONG TERM GOAL #1   Title Patient will report at least  50% overall improvement in subjective complaint to indicate improvement in ability to perform ADLs.    Time 4    Period Weeks    Status New    Target Date 04/12/21      PT LONG TERM GOAL #2   Title Patient will be independent with advance HEP and self-management strategies to improve functional outcomes    Time 4    Period Weeks    Status New    Target Date 04/12/21                   Plan - 03/22/21 1611     Clinical Impression Statement Patient tolerated session well today. Reviewed HEP exercise with good return. Progressed core strength and stabilization activity with cues of proper form and function of all added activity. Patient noting some challenge with posterior pelvic tilts on stability ball. Issued updated HEP handout. Patient will continue to benefit from skilled therapy services to progress hip and core strengthening for decreased back pain and improved functional ability.    Examination-Activity Limitations Lift;Stand;Locomotion Level;Transfers;Squat    Examination-Participation Restrictions Other;Community Activity;Cleaning    Stability/Clinical Decision Making Stable/Uncomplicated    Rehab Potential Good    PT  Frequency 1x / week    PT Duration 4 weeks    PT Treatment/Interventions ADLs/Self Care Home Management;Biofeedback;Moist Heat;Stair training;Balance training;Neuromuscular re-education;Passive range of motion;Scar mobilization;Compression bandaging;Orthotic Fit/Training;Taping;Vasopneumatic Device;Joint Manipulations;Visual/perceptual remediation/compensation;Dry needling;Manual techniques;Functional mobility training;Therapeutic activities;Patient/family education;Traction;Cryotherapy;Ultrasound;Parrafin;Fluidtherapy;Manual lymph drainage;Energy conservation;Splinting;Spinal Manipulations    PT Next Visit Plan Progress core and functional hip and core strength/ stabilization as tolerated. deadbug, ball marching, band work on ball, side plank, band sidestep    PT Home Exercise Plan Eval: ab brace, kegel, cat cow with ab brace, pelvic tilt on swiss ball Access Code: [email or web address]    Consulted and Agree with Plan of Care Patient             Patient will benefit from skilled therapeutic intervention in order to improve the following deficits and impairments:  Pain, Improper body mechanics, Impaired perceived functional ability, Hypermobility, Decreased strength, Decreased activity tolerance  Visit Diagnosis: Low back pain, unspecified back pain laterality, unspecified chronicity, unspecified whether sciatica present  Pain in right hip     Problem List Patient Active Problem List   Diagnosis Date Noted   Encounter for supervision of normal pregnancy, antepartum 12/09/2020   Depression/anxiety/PTSD/Bipolar/BPD 12/09/2020   History of cesarean section 11/04/2019   Mood disorder (HCC) 09/29/2019   Vegetarian diet 06/12/2019   Allergic rhinitis due to animal hair and dander 02/27/2018   Chronic sinusitis 02/05/2018   4:14 PM, 03/22/21 Georges Lynch PT DPT  Physical Therapist with [place]  [facility]  [phone]   [facility] [address] [place], [place], [address] Phone: [phone]   Fax:  [phone]  Name: Gavina Dildine MRN: [id number] Date of Birth: Nov 06, 1985

## 2021-03-28 ENCOUNTER — Other Ambulatory Visit: Payer: Medicaid Other

## 2021-03-28 ENCOUNTER — Other Ambulatory Visit: Payer: Self-pay

## 2021-03-28 ENCOUNTER — Ambulatory Visit (INDEPENDENT_AMBULATORY_CARE_PROVIDER_SITE_OTHER): Payer: Medicaid Other | Admitting: Obstetrics & Gynecology

## 2021-03-28 VITALS — BP 104/67 | HR 76 | Wt 211.0 lb

## 2021-03-28 DIAGNOSIS — Z3A27 27 weeks gestation of pregnancy: Secondary | ICD-10-CM

## 2021-03-28 DIAGNOSIS — Z98891 History of uterine scar from previous surgery: Secondary | ICD-10-CM

## 2021-03-28 DIAGNOSIS — Z3480 Encounter for supervision of other normal pregnancy, unspecified trimester: Secondary | ICD-10-CM

## 2021-03-28 NOTE — Progress Notes (Signed)
   LOW-RISK PREGNANCY VISIT Patient name: Katherine Fletcher MRN [id number]  Date of birth: 02/01/1986 Chief Complaint:   Routine Prenatal Visit (PN2 and VBAC consent)  History of Present Illness:   Katherine Fletcher is a 35 y.o. G7P1001 female at [redacted]w[redacted]d with an Estimated Date of Delivery: 06/23/21 being seen today for ongoing management of a low-risk pregnancy.  Depression screen [facility] 2/9 03/28/2021 12/09/2020 10/25/2020 05/26/2020  Decreased Interest 1 1 1 3   Down, Depressed, Hopeless 2 1 1 3   PHQ - 2 Score 3 2 2 6   Altered sleeping 3 0 2 1  Tired, decreased energy 3 3 3 1   Change in appetite 0 1 1 2   Feeling bad or failure about yourself  1 2 2 3   Trouble concentrating 3 1 1  0  Moving slowly or fidgety/restless 0 0 0 3  Suicidal thoughts 0 0 0 0  PHQ-9 Score 13 9 11 16   Difficult doing work/chores - - - Somewhat difficult    Today she reports no complaints. Contractions: Not present. Vag. Bleeding: None.  Movement: Present. denies leaking of fluid. Review of Systems:   Pertinent items are noted in HPI Denies abnormal vaginal discharge w/ itching/odor/irritation, headaches, visual changes, shortness of breath, chest pain, abdominal pain, severe nausea/vomiting, or problems with urination or bowel movements unless otherwise stated above. Pertinent History Reviewed:  Reviewed past medical,surgical, social, obstetrical and family history.  Reviewed problem list, medications and allergies. Physical Assessment:   Vitals:   03/28/21 0847  BP: 104/67  Pulse: 76  Weight: 211 lb (95.7 kg)  Body mass index is 31.16 kg/m.        Physical Examination:   General appearance: Well appearing, and in no distress  Mental status: Alert, oriented to person, place, and time  Skin: Warm & dry  Cardiovascular: Normal heart rate noted  Respiratory: Normal respiratory effort, no distress  Abdomen: Soft, gravid, nontender  Pelvic: Cervical exam deferred         Extremities: Edema: None  Fetal Status: Fetal Heart  Rate (bpm): 144 Fundal Height: 28 cm Movement: Present    Chaperone: n/a    No results found for this or any previous visit (from the past 24 hour(s)).  Assessment & Plan:  1) Low-risk pregnancy G2P1001 at [redacted]w[redacted]d with an Estimated Date of Delivery: 06/23/21   2) Previous C section, signed TOLAC permit today,    Meds: No orders of the defined types were placed in this encounter.  Labs/procedures today: partial PN2  Plan:  Continue routine obstetrical care  Next visit: prefers in person    Reviewed: Preterm labor symptoms and general obstetric precautions including but not limited to vaginal bleeding, contractions, leaking of fluid and fetal movement were reviewed in detail with the patient.  All questions were answered. Has home bp cuff. Rx faxed to . Check bp weekly, let know if >140/90.   Follow-up: Return in about 3 weeks (around 04/18/2021) for LROB.  No orders of the defined types were placed in this encounter.   , MD 03/28/2021 9:22 AM

## 2021-03-29 DIAGNOSIS — F603 Borderline personality disorder: Secondary | ICD-10-CM | POA: Diagnosis not present

## 2021-03-29 DIAGNOSIS — F4310 Post-traumatic stress disorder, unspecified: Secondary | ICD-10-CM | POA: Diagnosis not present

## 2021-03-30 ENCOUNTER — Other Ambulatory Visit: Payer: Self-pay

## 2021-03-30 ENCOUNTER — Other Ambulatory Visit: Payer: Medicaid Other

## 2021-03-30 DIAGNOSIS — Z3480 Encounter for supervision of other normal pregnancy, unspecified trimester: Secondary | ICD-10-CM

## 2021-03-30 DIAGNOSIS — Z3A27 27 weeks gestation of pregnancy: Secondary | ICD-10-CM | POA: Diagnosis not present

## 2021-03-31 ENCOUNTER — Ambulatory Visit (HOSPITAL_COMMUNITY): Payer: Medicaid Other | Admitting: Physical Therapy

## 2021-03-31 LAB — CBC
Hematocrit: 36.4 % (ref 34.0–46.6)
Hemoglobin: 12.6 g/dL (ref 11.1–15.9)
MCH: 34.3 pg — ABNORMAL HIGH (ref 26.6–33.0)
MCHC: 34.6 g/dL (ref 31.5–35.7)
MCV: 99 fL — ABNORMAL HIGH (ref 79–97)
Platelets: 159 10*3/uL (ref 150–450)
RBC: 3.67 x10E6/uL — ABNORMAL LOW (ref 3.77–5.28)
RDW: 12.9 % (ref 11.7–15.4)
WBC: 7.2 10*3/uL (ref 3.4–10.8)

## 2021-03-31 LAB — GLUCOSE TOLERANCE, 2 HOURS W/ 1HR
Glucose, 1 hour: 103 mg/dL (ref 70–179)
Glucose, 2 hour: 91 mg/dL (ref 70–152)
Glucose, Fasting: 64 mg/dL — ABNORMAL LOW (ref 70–91)

## 2021-03-31 LAB — HIV ANTIBODY (ROUTINE TESTING W REFLEX): HIV Screen 4th Generation wRfx: NONREACTIVE

## 2021-03-31 LAB — RPR: RPR Ser Ql: NONREACTIVE

## 2021-03-31 LAB — ANTIBODY SCREEN: Antibody Screen: NEGATIVE

## 2021-04-05 ENCOUNTER — Other Ambulatory Visit: Payer: Self-pay

## 2021-04-05 ENCOUNTER — Ambulatory Visit (HOSPITAL_COMMUNITY): Payer: Medicaid Other | Admitting: Physical Therapy

## 2021-04-05 ENCOUNTER — Encounter (HOSPITAL_COMMUNITY): Payer: Self-pay | Admitting: Physical Therapy

## 2021-04-05 DIAGNOSIS — F4310 Post-traumatic stress disorder, unspecified: Secondary | ICD-10-CM | POA: Diagnosis not present

## 2021-04-05 DIAGNOSIS — M25551 Pain in right hip: Secondary | ICD-10-CM | POA: Diagnosis not present

## 2021-04-05 DIAGNOSIS — F603 Borderline personality disorder: Secondary | ICD-10-CM | POA: Diagnosis not present

## 2021-04-05 DIAGNOSIS — M545 Low back pain, unspecified: Secondary | ICD-10-CM | POA: Diagnosis not present

## 2021-04-05 NOTE — Patient Instructions (Signed)
Access Code: [email or web address] URL: [email or web address] Date: 04/05/2021 Prepared by: Georges Lynch  Exercises Row with Anchored Resistance on Swiss Ball - 2 x daily - 7 x weekly - 2 sets - 10 reps Seated Shoulder Extension and Scapular Retraction with Resistance on Swiss Ball - 2 x daily - 7 x weekly - 2 sets - 10 reps Seated Piriformis Stretch - 2 x daily - 7 x weekly - 1 sets - 3 reps - 30 second hold Side Stepping with Resistance at Ankles and Counter Support - 2 x daily - 7 x weekly - 1 sets - 10 reps

## 2021-04-05 NOTE — Therapy (Signed)
[place], [place], [address] Phone: [phone]   Fax:  [phone]  Physical Therapy Treatment  Patient Details  Name: Katherine Fletcher MRN: [id number] Date of Birth: [birth year] Referring Provider (PT): Wells Guiles CNM   Encounter Date: 04/05/2021   PT End of Session - 04/05/21 1434     Visit Number 3    Number of Visits 4    Date for PT Re-Evaluation 04/12/21    Authorization Type Medicaid [employer]    Authorization Time Period 4 visits approved 10/26-11/22/22    PT Start Time 1430    PT Stop Time 1515    PT Time Calculation (min) 45 min    Activity Tolerance Patient tolerated treatment well    Behavior During Therapy [facility] for tasks assessed/performed             Past Medical History:  Diagnosis Date   Allergy    Anemia    Anxiety    Phreesia 05/23/2020   Cesarean delivery delivered 11/21/2019   Depression    Depression    Phreesia 05/23/2020   History of cesarean section 11/04/2019   Migraine    PTSD (post-traumatic stress disorder)    Vaginal Pap smear, abnormal     Past Surgical History:  Procedure Laterality Date   BREAST REDUCTION SURGERY     BREAST SURGERY N/A    Phreesia 05/23/2020   CESAREAN SECTION     CESAREAN SECTION N/A    Phreesia 05/23/2020   COSMETIC SURGERY N/A    Phreesia 05/23/2020   TONSILLECTOMY     WISDOM TOOTH EXTRACTION      There were no vitals filed for this visit.   Subjective Assessment - 04/05/21 1433     Subjective Was able to do more exercise this week. Likes the swiss ball exercises, feels good stretch and that this takes some pressure off the spine. Was having some sciatic pain in leg recently due to pregnancy.    Limitations Standing;Walking;Lifting    Patient Stated Goals Increase strength and stability    Currently in Pain? Yes    Pain Score 1     Pain Location Hip    Pain Orientation Right    Pain Descriptors / Indicators Aching    Pain  Type Acute pain    Pain Onset More than a month ago                               [facility] Adult PT Treatment/Exercise - 04/05/21 0001       Lumbar Exercises: Stretches   Piriformis Stretch 2 reps;30 seconds;Right      Lumbar Exercises: Standing   Other Standing Lumbar Exercises RTB sidestep 8 feet 5 RT      Lumbar Exercises: Seated   Other Seated Lumbar Exercises A/P and lateral pelvic tilts on stability ball x10 each, marching on stability ball x 10, corcles on ball 10 x CW/CCW, band rows and extension on stability ball RTB x 20 each, RTB pull aparts on ball 2 x 10                       PT Short Term Goals - 03/15/21 1535       PT SHORT TERM GOAL #1   Title Patient will be independent with initial HEP and self-management strategies to improve functional outcomes    Time 2  Period Weeks    Status New    Target Date 03/29/21               PT Long Term Goals - 03/15/21 1536       PT LONG TERM GOAL #1   Title Patient will report at least 50% overall improvement in subjective complaint to indicate improvement in ability to perform ADLs.    Time 4    Period Weeks    Status New    Target Date 04/12/21      PT LONG TERM GOAL #2   Title Patient will be independent with advance HEP and self-management strategies to improve functional outcomes    Time 4    Period Weeks    Status New    Target Date 04/12/21                   Plan - 04/05/21 1512     Clinical Impression Statement Patient tolerated session well today. Progressed core stabilization activity with added band strengthening on stability ball. Introduced banded hip abduction for LE and core strengthening. Patient educated on proper form and function of all added exercise. Patient will continue to benefit from skilled therapy services to progress core and LE strengthening for improved function and ability to perform ADLs.    Examination-Activity Limitations  Lift;Stand;Locomotion Level;Transfers;Squat    Examination-Participation Restrictions Other;Community Activity;Cleaning    Stability/Clinical Decision Making Stable/Uncomplicated    Rehab Potential Good    PT Frequency 1x / week    PT Duration 4 weeks    PT Treatment/Interventions ADLs/Self Care Home Management;Biofeedback;Moist Heat;Stair training;Balance training;Neuromuscular re-education;Passive range of motion;Scar mobilization;Compression bandaging;Orthotic Fit/Training;Taping;Vasopneumatic Device;Joint Manipulations;Visual/perceptual remediation/compensation;Dry needling;Manual techniques;Functional mobility training;Therapeutic activities;Patient/family education;Traction;Cryotherapy;Ultrasound;Parrafin;Fluidtherapy;Manual lymph drainage;Energy conservation;Splinting;Spinal Manipulations    PT Next Visit Plan Reassess, DC to HEP if all goals met    PT Home Exercise Plan Eval: ab brace, kegel, cat cow with ab brace, pelvic tilt on swiss ball Access Code: [email or web address] band sidestep, band rows extension, seated piriformis stretch    Consulted and Agree with Plan of Care Patient             Patient will benefit from skilled therapeutic intervention in order to improve the following deficits and impairments:  Pain, Improper body mechanics, Impaired perceived functional ability, Hypermobility, Decreased strength, Decreased activity tolerance  Visit Diagnosis: Low back pain, unspecified back pain laterality, unspecified chronicity, unspecified whether sciatica present  Pain in right hip     Problem List Patient Active Problem List   Diagnosis Date Noted   Encounter for supervision of normal pregnancy, antepartum 12/09/2020   Depression/anxiety/PTSD/Bipolar/BPD 12/09/2020   History of cesarean section 11/04/2019   Mood disorder ([place]) 09/29/2019   Vegetarian diet 06/12/2019   Allergic rhinitis due to animal hair and dander 02/27/2018   Chronic sinusitis 02/05/2018   3:17 PM,  04/05/21 Josue Hector PT DPT  Physical Therapist with [facility]  [phone] [place] [address] [place], [place], [address] Phone: [phone]   Fax:  [phone]  Name: Katherine Fletcher MRN: [id number] Date of Birth: 1985-09-28

## 2021-04-12 ENCOUNTER — Other Ambulatory Visit: Payer: Self-pay

## 2021-04-12 ENCOUNTER — Emergency Department (HOSPITAL_COMMUNITY)
Admission: EM | Admit: 2021-04-12 | Discharge: 2021-04-13 | Disposition: A | Discharge status: Home / Self Care | Payer: Medicaid Other | Attending: Emergency Medicine | Admitting: Emergency Medicine

## 2021-04-12 ENCOUNTER — Encounter (HOSPITAL_COMMUNITY): Payer: Self-pay | Admitting: *Deleted

## 2021-04-12 ENCOUNTER — Emergency Department (HOSPITAL_COMMUNITY): Payer: Medicaid Other

## 2021-04-12 ENCOUNTER — Ambulatory Visit (HOSPITAL_COMMUNITY): Payer: Medicaid Other | Admitting: Physical Therapy

## 2021-04-12 DIAGNOSIS — R059 Cough, unspecified: Secondary | ICD-10-CM

## 2021-04-12 DIAGNOSIS — Z91040 Latex allergy status: Secondary | ICD-10-CM | POA: Insufficient documentation

## 2021-04-12 DIAGNOSIS — Z20822 Contact with and (suspected) exposure to covid-19: Secondary | ICD-10-CM | POA: Insufficient documentation

## 2021-04-12 DIAGNOSIS — J101 Influenza due to other identified influenza virus with other respiratory manifestations: Secondary | ICD-10-CM | POA: Insufficient documentation

## 2021-04-12 DIAGNOSIS — R509 Fever, unspecified: Secondary | ICD-10-CM | POA: Diagnosis present

## 2021-04-12 DIAGNOSIS — R079 Chest pain, unspecified: Secondary | ICD-10-CM | POA: Diagnosis not present

## 2021-04-12 DIAGNOSIS — R000 Tachycardia, unspecified: Secondary | ICD-10-CM | POA: Diagnosis not present

## 2021-04-12 DIAGNOSIS — J111 Influenza due to unidentified influenza virus with other respiratory manifestations: Secondary | ICD-10-CM

## 2021-04-12 DIAGNOSIS — O99413 Diseases of the circulatory system complicating pregnancy, third trimester: Secondary | ICD-10-CM | POA: Diagnosis not present

## 2021-04-12 DIAGNOSIS — O98513 Other viral diseases complicating pregnancy, third trimester: Secondary | ICD-10-CM | POA: Diagnosis not present

## 2021-04-12 DIAGNOSIS — J029 Acute pharyngitis, unspecified: Secondary | ICD-10-CM | POA: Diagnosis not present

## 2021-04-12 MED ORDER — ACETAMINOPHEN 325 MG PO TABS
650.0000 mg | ORAL_TABLET | Freq: Once | ORAL | Status: AC
  Administered 2021-04-13: 650 mg via ORAL
  Filled 2021-04-12: qty 2

## 2021-04-12 MED ORDER — SODIUM CHLORIDE 0.9 % IV BOLUS
1000.0000 mL | Freq: Once | INTRAVENOUS | Status: AC
  Administered 2021-04-13: 1000 mL via INTRAVENOUS

## 2021-04-12 NOTE — ED Provider Notes (Signed)
[facility] EMERGENCY DEPARTMENT Provider Note   CSN: [id number] Arrival date & time: 04/12/21  2052     History Chief Complaint  Patient presents with   Fever    [redacted] wks pregnant    Katherine Fletcher is a 35 y.o. adult.  Patient [redacted] weeks pregnant G2, P1.  Katherine Fletcher has a 2-day history of body aches, cough, headache, sore throat, fatigue.  Symptoms are worse today.  Cough productive of yellow mucus and chest pain with coughing.  Fevers at home up to 102 last took Tylenol at 3 PM.  COVID test was negative at home but has had sick contacts.  Complains of diffuse headache that is gradual in onset, sinus congestion, sore throat, productive cough chest pain with coughing and pain with breathing.  Denies abdominal pain.  Denies gush of fluid or vaginal bleeding.  Denies contractions.  States fetal movement is somewhat less but Katherine Fletcher has been sleeping more.  Good p.o. intake and urine output.  Katherine Fletcher is COVID and flu vaccinated. Feels short of breath and feels like Katherine Fletcher is coughing and getting worse in terms of her breathing and having fevers so Katherine Fletcher wanted to be evaluated. No previous breathing issues.  No history of asthma.  The history is provided by the patient.  Fever Associated symptoms: chest pain, chills, congestion, cough, headaches, myalgias, rhinorrhea and sore throat   Associated symptoms: no diarrhea, no dysuria, no nausea and no vomiting       Past Medical History:  Diagnosis Date   Allergy    Anemia    Anxiety    Phreesia 05/23/2020   Cesarean delivery delivered 11/21/2019   Depression    Depression    Phreesia 05/23/2020   History of cesarean section 11/04/2019   Migraine    PTSD (post-traumatic stress disorder)    Vaginal Pap smear, abnormal     Patient Active Problem List   Diagnosis Date Noted   Encounter for supervision of normal pregnancy, antepartum 12/09/2020   Depression/anxiety/PTSD/Bipolar/BPD 12/09/2020   History of cesarean section 11/04/2019   Mood disorder (HCC)  09/29/2019   Vegetarian diet 06/12/2019   Allergic rhinitis due to animal hair and dander 02/27/2018   Chronic sinusitis 02/05/2018    Past Surgical History:  Procedure Laterality Date   BREAST REDUCTION SURGERY     BREAST SURGERY N/A    Phreesia 05/23/2020   CESAREAN SECTION     CESAREAN SECTION N/A    Phreesia 05/23/2020   COSMETIC SURGERY N/A    Phreesia 05/23/2020   TONSILLECTOMY     WISDOM TOOTH EXTRACTION       OB History     Gravida  2   Para  1   Term  1   Preterm      AB      Living  1      SAB      IAB      Ectopic      Multiple      Live Births  1           Family History  Problem Relation Age of Onset   Obesity Mother    Depression Mother    Arthritis Mother    Obesity Father    Rheum arthritis Father    Hypothyroidism Father    Obesity Sister    Polycystic ovary syndrome Sister    Cancer Maternal Grandmother    Heart disease Maternal Grandfather    Hypothyroidism Paternal Grandmother    Arthritis  Paternal Grandmother    Heart attack Paternal Grandfather    Arthritis Paternal Grandfather     Social History   Tobacco Use   Smoking status: Never   Smokeless tobacco: Never  Vaping Use   Vaping Use: Never used  Substance Use Topics   Alcohol use: Not Currently   Drug use: No    Home Medications Prior to Admission medications   Medication Sig Start Date End Date Taking? Authorizing Provider  cholecalciferol (VITAMIN D3) 25 MCG (1000 UNIT) tablet     [provider]  Cobalamin Combinations (B-12) [phone] MCG SUBL  07/07/20   [provider]  Prenatal MV & Min w/FA-DHA (PRENATAL GUMMIES PO) Take by mouth.    [provider]    Allergies    Other, Cat hair extract, and Latex  Review of Systems   Review of Systems  Constitutional:  Positive for activity change, appetite change, chills, fatigue and fever.  HENT:  Positive for congestion, rhinorrhea and sore throat.   Eyes:  Negative for visual  disturbance.  Respiratory:  Positive for cough, chest tightness and shortness of breath.   Cardiovascular:  Positive for chest pain.  Gastrointestinal:  Negative for abdominal pain, diarrhea, nausea and vomiting.  Genitourinary:  Negative for dysuria, hematuria and urgency.  Musculoskeletal:  Positive for arthralgias and myalgias.  Neurological:  Positive for weakness and headaches. Negative for dizziness.   all other systems are negative except as noted in the HPI and PMH.   Physical Exam Updated Vital Signs BP 121/62 (BP Location: Right Arm)   Pulse (!) 128   Temp (!) 100.7 F (38.2 C) (Oral)   Resp 20   Ht 5\' 9"  (1.753 m)   Wt 97.9 kg   LMP 09/16/2020 (Exact Date)   SpO2 99%   BMI 31.88 kg/m   Physical Exam Vitals and nursing note reviewed.  Constitutional:      General: Katherine Fletcher is not in acute distress.    Appearance: Katherine Fletcher is well-developed. Katherine Fletcher is not ill-appearing or toxic-appearing.     Comments: No distress, no increased work of breathing  HENT:     Head: Normocephalic and atraumatic.     Nose: Congestion present.     Mouth/Throat:     Pharynx: No oropharyngeal exudate or posterior oropharyngeal erythema.     Comments: Uvula midline, no erythema or exudate Eyes:     Conjunctiva/sclera: Conjunctivae normal.     Pupils: Pupils are equal, round, and reactive to light.  Neck:     Comments: No meningismus. Cardiovascular:     Rate and Rhythm: Regular rhythm. Tachycardia present.     Heart sounds: Normal heart sounds. No murmur heard. Pulmonary:     Effort: Pulmonary effort is normal. No respiratory distress.     Breath sounds: Normal breath sounds.  Chest:     Chest wall: No tenderness.  Abdominal:     Palpations: Abdomen is soft.     Tenderness: There is no abdominal tenderness. There is no guarding or rebound.     Comments: Gravid abdomen  Musculoskeletal:        General: No tenderness. Normal range of motion.     Cervical back: Normal range of motion and neck  supple.  Skin:    General: Skin is warm.  Neurological:     Mental Status: Katherine Fletcher is alert and oriented to person, place, and time.     Cranial Nerves: No cranial nerve deficit.     Motor: No abnormal muscle  tone.     Coordination: Coordination normal.     Comments:  5/5 strength throughout. CN 2-12 intact.Equal grip strength.   Psychiatric:        Behavior: Behavior normal.    ED Results / Procedures / Treatments   Labs (all labs ordered are listed, but only abnormal results are displayed) Labs Reviewed  RESP PANEL BY RT-PCR (FLU A&B, COVID) ARPGX2 - Abnormal; Notable for the following components:      Result Value   Influenza A by PCR POSITIVE (*)    All other components within normal limits  CBC WITH DIFFERENTIAL/PLATELET - Abnormal; Notable for the following components:   RBC 3.82 (*)    MCV 100.3 (*)    Platelets 134 (*)    Lymphs Abs 0.6 (*)    All other components within normal limits  COMPREHENSIVE METABOLIC PANEL - Abnormal; Notable for the following components:   Sodium 130 (*)    CO2 21 (*)    BUN 5 (*)    Calcium 8.1 (*)    Albumin 3.2 (*)    All other components within normal limits  URINALYSIS, ROUTINE W REFLEX MICROSCOPIC - Abnormal; Notable for the following components:   APPearance HAZY (*)    Ketones, ur 80 (*)    Protein, ur 30 (*)    Bacteria, UA RARE (*)    All other components within normal limits  GROUP A STREP BY PCR  CULTURE, BLOOD (ROUTINE X 2)  CULTURE, BLOOD (ROUTINE X 2)  URINE CULTURE  LACTIC ACID, PLASMA  LACTIC ACID, PLASMA    EKG None  Radiology DG Chest Port 1 View  Result Date: 04/13/2021 CLINICAL DATA:  Cough and sore throat, [redacted] weeks pregnant EXAM: PORTABLE CHEST 1 VIEW COMPARISON:  None. FINDINGS: Cardiac shadow is within normal limits. Lungs are clear. No bony abnormality is noted. IMPRESSION: No active disease. Electronically Signed   By: Inez Catalina M.D.   On: 04/13/2021 00:47    Procedures Procedures   Medications  Ordered in ED Medications - No data to display  ED Course  I have reviewed the triage vital signs and the nursing notes.  Pertinent labs & imaging results that were available during my care of the patient were reviewed by me and considered in my medical decision making (see chart for details).    MDM Rules/Calculators/A&P                           G2 P1 at [redacted] weeks gestation here with a 2-day history of cough, runny nose, sore throat, shortness of breath, chest pain with coughing, fever.  Tachycardic and febrile on arrival.  Katherine Fletcher is given antipyretics and IV fluids will obtain labs, chest x-ray, fluid and COVID swab  Tocometry cleared by OB at [facility].  Mild tachycardia.  Patient given antipyretics as well as IV fluids.  Chest x-ray is negative  Labs reassuring, flu swab is positive.  Patient tachycardia has improved.  Blood pressure soft in the 90s.  Additional IV fluids given. UA with ketones.  No infection.  Culture is sent.  Patient afebrile, no hypoxia no increased work of breathing Denies chest pain Blood pressure improved to 100s. Patient tolerating PO and ambulatory.  D/w patient PO hydration, antipyretics, tamiflu. PCP and OB followup.  Return to the ED if you develop difficulty breathing, chest pain, intractable vomiting or any other concerns.  Final Clinical Impression(s) / ED Diagnoses Final diagnoses:  Influenza  Rx / DC Orders ED Discharge Orders     None        Shanzay Hepworth, Annie Main, MD 04/13/21 [phone]

## 2021-04-12 NOTE — ED Triage Notes (Signed)
Pt is [redacted] wks pregnant (second pregnancy).  Pt with cough, HA, sore throat, fatigue and body aches for the past 2 days.  Worse today.  Covid test at home x 2 were negative. Last dose tylenol 500mg  at 1500 and Robitussin as well.

## 2021-04-13 ENCOUNTER — Ambulatory Visit: Payer: Medicaid Other

## 2021-04-13 DIAGNOSIS — R059 Cough, unspecified: Secondary | ICD-10-CM | POA: Diagnosis not present

## 2021-04-13 DIAGNOSIS — J029 Acute pharyngitis, unspecified: Secondary | ICD-10-CM | POA: Diagnosis not present

## 2021-04-13 LAB — URINALYSIS, ROUTINE W REFLEX MICROSCOPIC
Bilirubin Urine: NEGATIVE
Glucose, UA: NEGATIVE mg/dL
Hgb urine dipstick: NEGATIVE
Ketones, ur: 80 mg/dL — AB
Leukocytes,Ua: NEGATIVE
Nitrite: NEGATIVE
Protein, ur: 30 mg/dL — AB
Specific Gravity, Urine: 1.016 (ref 1.005–1.030)
pH: 6 (ref 5.0–8.0)

## 2021-04-13 LAB — RESP PANEL BY RT-PCR (FLU A&B, COVID) ARPGX2
Influenza A by PCR: POSITIVE — AB
Influenza B by PCR: NEGATIVE
SARS Coronavirus 2 by RT PCR: NEGATIVE

## 2021-04-13 LAB — COMPREHENSIVE METABOLIC PANEL
ALT: 15 U/L (ref 0–44)
AST: 17 U/L (ref 15–41)
Albumin: 3.2 g/dL — ABNORMAL LOW (ref 3.5–5.0)
Alkaline Phosphatase: 75 U/L (ref 38–126)
Anion gap: 8 (ref 5–15)
BUN: 5 mg/dL — ABNORMAL LOW (ref 6–20)
CO2: 21 mmol/L — ABNORMAL LOW (ref 22–32)
Calcium: 8.1 mg/dL — ABNORMAL LOW (ref 8.9–10.3)
Chloride: 101 mmol/L (ref 98–111)
Creatinine, Ser: 0.49 mg/dL (ref 0.44–1.00)
GFR, Estimated: >60 mL/min (ref >60)
Glucose, Bld: 95 mg/dL (ref 70–99)
Potassium: 3.7 mmol/L (ref 3.5–5.1)
Sodium: 130 mmol/L — ABNORMAL LOW (ref 135–145)
Total Bilirubin: 0.7 mg/dL (ref 0.3–1.2)
Total Protein: 6.6 g/dL (ref 6.5–8.1)

## 2021-04-13 LAB — CBC WITH DIFFERENTIAL/PLATELET
Abs Immature Granulocytes: 0.07 10*3/uL (ref 0.00–0.07)
Basophils Absolute: 0 10*3/uL (ref 0.0–0.1)
Basophils Relative: 0 %
Eosinophils Absolute: 0 10*3/uL (ref 0.0–0.5)
Eosinophils Relative: 0 %
HCT: 38.3 % (ref 36.0–46.0)
Hemoglobin: 13 g/dL (ref 12.0–15.0)
Immature Granulocytes: 1 %
Lymphocytes Relative: 8 %
Lymphs Abs: 0.6 10*3/uL — ABNORMAL LOW (ref 0.7–4.0)
MCH: 34 pg (ref 26.0–34.0)
MCHC: 33.9 g/dL (ref 30.0–36.0)
MCV: 100.3 fL — ABNORMAL HIGH (ref 80.0–100.0)
Monocytes Absolute: 0.4 10*3/uL (ref 0.1–1.0)
Monocytes Relative: 6 %
Neutro Abs: 6.8 10*3/uL (ref 1.7–7.7)
Neutrophils Relative %: 85 %
Platelets: 134 10*3/uL — ABNORMAL LOW (ref 150–400)
RBC: 3.82 MIL/uL — ABNORMAL LOW (ref 3.87–5.11)
RDW: 14 % (ref 11.5–15.5)
WBC: 8 10*3/uL (ref 4.0–10.5)
nRBC: 0 % (ref 0.0–0.2)

## 2021-04-13 LAB — LACTIC ACID, PLASMA
Lactic Acid, Venous: 0.9 mmol/L (ref 0.5–1.9)
Lactic Acid, Venous: 1 mmol/L (ref 0.5–1.9)

## 2021-04-13 LAB — GROUP A STREP BY PCR: Group A Strep by PCR: NOT DETECTED

## 2021-04-13 MED ORDER — SODIUM CHLORIDE 0.9 % IV BOLUS
1000.0000 mL | Freq: Once | INTRAVENOUS | Status: AC
  Administered 2021-04-13: 1000 mL via INTRAVENOUS

## 2021-04-13 MED ORDER — OSELTAMIVIR PHOSPHATE 75 MG PO CAPS: 75.0000 mg | ORAL_CAPSULE | Freq: Two times a day (BID) | ORAL | 0 refills | Status: DC

## 2021-04-13 NOTE — Progress Notes (Signed)
This RN called Dr. Charlotta Newton with report of reactive strip and no contractions. Dr. Charlotta Newton confirmed verbal order that patient is cleared from [facility] and can be removed from the monitor.   This RN called Claris Gower, RN at [facility] and advised above information/verbal order.  Lovenia Shuck, RN RROB

## 2021-04-13 NOTE — ED Notes (Signed)
Pt given water and tolerating well.  

## 2021-04-13 NOTE — ED Notes (Signed)
Pt tolerated fluid challenge well and given another cup of water.

## 2021-04-13 NOTE — Progress Notes (Signed)
Received call from [facility] at [facility] with report of "Pt is [redacted] wks pregnant (second pregnancy).  Pt with cough, HA, sore throat, fatigue and body aches for the past 2 days.  Worse today.  Covid test at home x 2 were negative. Last dose tylenol 500mg  at 1500 and Robitussin as well."  Patient has no OB complaints at this time. This RN called Dr. with above report. Verbal order received that patient can be cleared from OB with reactive strip.  Charlotta Newton, RN  RROB

## 2021-04-13 NOTE — Discharge Instructions (Addendum)
Keep yourself hydrated.  Use Tylenol as needed for aches and fevers.  Follow-up with your primary doctor as well as OB doctor.  Return to the ED with difficulty breathing, intractable nausea and vomiting, chest pain, shortness of breath or any other concerns.

## 2021-04-13 NOTE — ED Notes (Signed)
Ashok Cordia, RN at [facility] hospital has cleared pt from [facility] monitoring -Dr Manus Gunning made aware.

## 2021-04-14 LAB — URINE CULTURE

## 2021-04-15 ENCOUNTER — Telehealth: Payer: Self-pay

## 2021-04-15 NOTE — Telephone Encounter (Signed)
Transition Care Management Unsuccessful Follow-up Telephone Call  Date of discharge and from where:  04/13/2021-[place]   Attempts:  1st Attempt  Reason for unsuccessful TCM follow-up call:  Left voice message

## 2021-04-18 ENCOUNTER — Ambulatory Visit (INDEPENDENT_AMBULATORY_CARE_PROVIDER_SITE_OTHER): Payer: Medicaid Other | Admitting: Obstetrics & Gynecology

## 2021-04-18 ENCOUNTER — Encounter: Payer: Self-pay | Admitting: Obstetrics & Gynecology

## 2021-04-18 ENCOUNTER — Other Ambulatory Visit: Payer: Self-pay

## 2021-04-18 VITALS — BP 105/69 | HR 73 | Wt 214.0 lb

## 2021-04-18 DIAGNOSIS — Z3483 Encounter for supervision of other normal pregnancy, third trimester: Secondary | ICD-10-CM

## 2021-04-18 DIAGNOSIS — Z98891 History of uterine scar from previous surgery: Secondary | ICD-10-CM

## 2021-04-18 LAB — CULTURE, BLOOD (ROUTINE X 2)
Culture: NO GROWTH
Culture: NO GROWTH
Special Requests: ADEQUATE
Special Requests: ADEQUATE

## 2021-04-18 NOTE — Telephone Encounter (Signed)
Transition Care Management Unsuccessful Follow-up Telephone Call  Date of discharge and from where:  04/13/2021-[place]   Attempts:  2nd Attempt  Reason for unsuccessful TCM follow-up call:  Left voice message

## 2021-04-18 NOTE — Progress Notes (Signed)
Low- RISK PREGNANCY VISIT Patient name: Katherine Fletcher MRN [id number]  Date of birth: [birth year] Chief Complaint:   Routine Prenatal Visit  History of Present Illness:   Katherine Fletcher is a 35 y.o. G31P1001 female at [redacted]w[redacted]d with an Estimated Date of Delivery: 06/23/21 being seen today for ongoing management of a low-risk pregnancy complicated by:  -prior C-section- desires TOLAC -PTSD/Anx/Dep- doing better now that she is going to therapy.  No medication at this time.  She does have concerns regarding labor- questions were addressed today.    Today she reports no complaints.   Contractions: Not present. Vag. Bleeding: None.  Movement: Present. denies leaking of fluid.   Depression screen [facility] 2/9 03/28/2021 12/09/2020 10/25/2020 05/26/2020  Decreased Interest 1 1 1 3   Down, Depressed, Hopeless 2 1 1 3   PHQ - 2 Score 3 2 2 6   Altered sleeping 3 0 2 1  Tired, decreased energy 3 3 3 1   Change in appetite 0 1 1 2   Feeling bad or failure about yourself  1 2 2 3   Trouble concentrating 3 1 1  0  Moving slowly or fidgety/restless 0 0 0 3  Suicidal thoughts 0 0 0 0  PHQ-9 Score 13 9 11 16   Difficult doing work/chores - - - Somewhat difficult     Current Outpatient Medications  Medication Instructions   cholecalciferol (VITAMIN D3) 25 MCG (1000 UNIT) tablet No dose, route, or frequency recorded.   Cobalamin Combinations (B-12) [phone] MCG SUBL No dose, route, or frequency recorded.   oseltamivir (TAMIFLU) 75 mg, Oral, Every 12 hours   Prenatal MV & Min w/FA-DHA (PRENATAL GUMMIES PO) Oral     Review of Systems:   Pertinent items are noted in HPI Denies abnormal vaginal discharge w/ itching/odor/irritation, headaches, visual changes, shortness of breath, chest pain, abdominal pain, severe nausea/vomiting, or problems with urination or bowel movements unless otherwise stated above. Pertinent History Reviewed:  Reviewed past medical,surgical, social, obstetrical and family history.  Reviewed problem  list, medications and allergies. Physical Assessment:   Vitals:   04/18/21 1152  BP: 105/69  Pulse: 73  Weight: 214 lb (97.1 kg)  Body mass index is 31.6 kg/m.           Physical Examination:   General appearance: alert, well appearing, and in no distress  Mental status: normal mood, behavior, speech, dress, motor activity, and thought processes  Skin: warm & dry   Extremities: Edema: None    Cardiovascular: normal heart rate noted  Respiratory: normal respiratory effort, no distress  Abdomen: gravid, soft, non-tender  Pelvic: Cervical exam deferred         Fetal Status: Fetal Heart Rate (bpm): 150 Fundal Height: 29 cm Movement: Present    Chaperone: N/A    No results found for this or any previous visit (from the past 24 hour(s)).   Assessment & Plan:  Low-risk pregnancy: G2P1001 at [redacted]w[redacted]d with an Estimated Date of Delivery: 06/23/21   1) TOLAC consent already obtained  2) PTSD/anxiety/Depression/Bipolar- encouraged pt to continue with regular therapy and should she note worsening of her symptoms or become interested in re-starting medication be sure to discuss at visits  Meds: No orders of the defined types were placed in this encounter.   Treatment Plan:  OB care as outlined above  Reviewed: Preterm labor symptoms and general obstetric precautions including but not limited to vaginal bleeding, contractions, leaking of fluid and fetal movement were reviewed in detail with the patient.  All questions  were answered. Pt has home bp cuff. Check bp weekly, let us know if >140/90.   Follow-up: Return in about 2 weeks (around 05/02/2021) for LROB visit.   Future Appointments  Date Time Provider Department Center  05/02/2021 10:10 AM Myna Hidalgo, DO [facility]    No orders of the defined types were placed in this encounter.   Myna Hidalgo, DO Attending Obstetrician & Gynecologist, [facility] at [place] for [employer], [facility]

## 2021-04-19 DIAGNOSIS — F603 Borderline personality disorder: Secondary | ICD-10-CM | POA: Diagnosis not present

## 2021-04-19 DIAGNOSIS — F4310 Post-traumatic stress disorder, unspecified: Secondary | ICD-10-CM | POA: Diagnosis not present

## 2021-04-19 NOTE — Telephone Encounter (Signed)
Transition Care Management Unsuccessful Follow-up Telephone Call  Date of discharge and from where:  04/13/2021 from [facility]  Attempts:  3rd Attempt  Reason for unsuccessful TCM follow-up call:  Unable to reach patient

## 2021-05-02 ENCOUNTER — Ambulatory Visit (INDEPENDENT_AMBULATORY_CARE_PROVIDER_SITE_OTHER): Payer: Medicaid Other | Admitting: Obstetrics & Gynecology

## 2021-05-02 ENCOUNTER — Other Ambulatory Visit: Payer: Self-pay

## 2021-05-02 ENCOUNTER — Encounter: Payer: Self-pay | Admitting: Obstetrics & Gynecology

## 2021-05-02 VITALS — BP 92/66 | HR 84 | Wt 218.0 lb

## 2021-05-02 DIAGNOSIS — Z23 Encounter for immunization: Secondary | ICD-10-CM | POA: Diagnosis not present

## 2021-05-02 DIAGNOSIS — Z3480 Encounter for supervision of other normal pregnancy, unspecified trimester: Secondary | ICD-10-CM | POA: Diagnosis not present

## 2021-05-02 DIAGNOSIS — Z3A32 32 weeks gestation of pregnancy: Secondary | ICD-10-CM | POA: Diagnosis not present

## 2021-05-02 NOTE — Progress Notes (Signed)
   LOW-RISK PREGNANCY VISIT Patient name: Katherine Fletcher MRN [id number]  Date of birth: September 09, 1985 Chief Complaint:   Routine Prenatal Visit  History of Present Illness:   Katherine Fletcher is a 35 y.o. G82P1001 female at [redacted]w[redacted]d with an Estimated Date of Delivery: 06/23/21 being seen today for ongoing management of a low-risk pregnancy.   Prior C-section- desires TOLAC -PTSD/anxiety/depression- no medication currently  Depression screen [facility] 2/9 03/28/2021 12/09/2020 10/25/2020 05/26/2020  Decreased Interest 1 1 1 3   Down, Depressed, Hopeless 2 1 1 3   PHQ - 2 Score 3 2 2 6   Altered sleeping 3 0 2 1  Tired, decreased energy 3 3 3 1   Change in appetite 0 1 1 2   Feeling bad or failure about yourself  1 2 2 3   Trouble concentrating 3 1 1  0  Moving slowly or fidgety/restless 0 0 0 3  Suicidal thoughts 0 0 0 0  PHQ-9 Score 13 9 11 16   Difficult doing work/chores - - - Somewhat difficult    Today she reports occasional hip pain and difficulty sleeping.  Overall doing ok.  Contractions: Irritability. Vag. Bleeding: None.  Movement: Present. denies leaking of fluid. Review of Systems:   Pertinent items are noted in HPI Denies abnormal vaginal discharge w/ itching/odor/irritation, headaches, visual changes, shortness of breath, chest pain, abdominal pain, severe nausea/vomiting, or problems with urination or bowel movements unless otherwise stated above. Pertinent History Reviewed:  Reviewed past medical,surgical, social, obstetrical and family history.  Reviewed problem list, medications and allergies.  Physical Assessment:   Vitals:   05/02/21 1020  BP: 92/66  Pulse: 84  Weight: 218 lb (98.9 kg)  Body mass index is 32.19 kg/m.        Physical Examination:   General appearance: Well appearing, and in no distress  Mental status: Alert, oriented to person, place, and time  Skin: Warm & dry  Respiratory: Normal respiratory effort, no distress  Abdomen: Soft, gravid, nontender  Pelvic: Cervical exam  deferred         Extremities: Edema: None  Psych:  mood and affect appropriate  Fetal Status: Fetal Heart Rate (bpm): 140 Fundal Height: 30 cm Movement: Present    Chaperone: n/a    No results found for this or any previous visit (from the past 24 hour(s)).   Assessment & Plan:  1) Low-risk pregnancy G2P1001 at [redacted]w[redacted]d with an Estimated Date of Delivery: 06/23/21   -plan for TOLAC   Meds: No orders of the defined types were placed in this encounter.  Labs/procedures today: doppler  Plan:  Continue routine obstetrical care  Next visit: prefers in person    Reviewed: Preterm labor symptoms and general obstetric precautions including but not limited to vaginal bleeding, contractions, leaking of fluid and fetal movement were reviewed in detail with the patient.  All questions were answered.   Follow-up: Return in about 2 weeks (around 05/16/2021) for LROB visit.  Orders Placed This Encounter  Procedures   Tdap vaccine greater than or equal to 7yo IM    , DO Attending Obstetrician & Gynecologist, [facility] for , [facility]

## 2021-05-03 DIAGNOSIS — F603 Borderline personality disorder: Secondary | ICD-10-CM | POA: Diagnosis not present

## 2021-05-03 DIAGNOSIS — F4310 Post-traumatic stress disorder, unspecified: Secondary | ICD-10-CM | POA: Diagnosis not present

## 2021-05-10 DIAGNOSIS — F603 Borderline personality disorder: Secondary | ICD-10-CM | POA: Diagnosis not present

## 2021-05-10 DIAGNOSIS — F4310 Post-traumatic stress disorder, unspecified: Secondary | ICD-10-CM | POA: Diagnosis not present

## 2021-05-17 DIAGNOSIS — F4310 Post-traumatic stress disorder, unspecified: Secondary | ICD-10-CM | POA: Diagnosis not present

## 2021-05-17 DIAGNOSIS — F603 Borderline personality disorder: Secondary | ICD-10-CM | POA: Diagnosis not present

## 2021-05-18 ENCOUNTER — Encounter: Payer: Medicaid Other | Admitting: Advanced Practice Midwife

## 2021-05-19 ENCOUNTER — Encounter: Payer: Self-pay | Admitting: Advanced Practice Midwife

## 2021-05-19 ENCOUNTER — Other Ambulatory Visit: Payer: Self-pay

## 2021-05-19 ENCOUNTER — Ambulatory Visit (INDEPENDENT_AMBULATORY_CARE_PROVIDER_SITE_OTHER): Payer: Medicaid Other | Admitting: Advanced Practice Midwife

## 2021-05-19 VITALS — BP 95/67 | HR 90 | Wt 222.0 lb

## 2021-05-19 DIAGNOSIS — Z98891 History of uterine scar from previous surgery: Secondary | ICD-10-CM

## 2021-05-19 DIAGNOSIS — Z3480 Encounter for supervision of other normal pregnancy, unspecified trimester: Secondary | ICD-10-CM

## 2021-05-19 DIAGNOSIS — Z3A35 35 weeks gestation of pregnancy: Secondary | ICD-10-CM

## 2021-05-19 NOTE — Patient Instructions (Signed)

## 2021-05-19 NOTE — Progress Notes (Signed)
° °  LOW-RISK PREGNANCY VISIT Patient name: Katherine Fletcher MRN [id number]  Date of birth: Oct 13, 1985 Chief Complaint:   Routine Prenatal Visit  History of Present Illness:   Katherine Fletcher is a 35 y.o. G72P1001 female at [redacted]w[redacted]d with an Estimated Date of Delivery: 06/23/21 being seen today for ongoing management of a low-risk pregnancy.  Today she reports no complaints. Contractions: Irritability. Vag. Bleeding: None.  Movement: Present. denies leaking of fluid. Review of Systems:   Pertinent items are noted in HPI Denies abnormal vaginal discharge w/ itching/odor/irritation, headaches, visual changes, shortness of breath, chest pain, abdominal pain, severe nausea/vomiting, or problems with urination or bowel movements unless otherwise stated above. Pertinent History Reviewed:  Reviewed past medical,surgical, social, obstetrical and family history.  Reviewed problem list, medications and allergies. Physical Assessment:   Vitals:   05/19/21 1032  BP: 95/67  Pulse: 90  Weight: 222 lb (100.7 kg)  Body mass index is 32.78 kg/m.        Physical Examination:   General appearance: Well appearing, and in no distress  Mental status: Alert, oriented to person, place, and time  Skin: Warm & dry  Cardiovascular: Normal heart rate noted  Respiratory: Normal respiratory effort, no distress  Abdomen: Soft, gravid, nontender  Pelvic: Cervical exam deferred         Extremities: Edema: Trace  Fetal Status: Fetal Heart Rate (bpm): 148 Fundal Height: 33 cm Movement: Present    Chaperone: n/a    No results found for this or any previous visit (from the past 24 hour(s)).  Assessment & Plan:  1) Low-risk pregnancy G2P1001 at [redacted]w[redacted]d with an Estimated Date of Delivery: 06/23/21   2) wants TOLAC, consent signed;  avoid IOL if possible  3)  bipolar/ptsd/anx/dep, no meds:  in therapy, helping a lot   Meds: No orders of the defined types were placed in this encounter.  Labs/procedures today: none  Plan:   Continue routine obstetrical care  Next visit: prefers in person    Reviewed: Term labor symptoms and general obstetric precautions including but not limited to vaginal bleeding, contractions, leaking of fluid and fetal movement were reviewed in detail with the patient.  All questions were answered. Has home bp cuff. . Check bp weekly, let us know if >140/90.   Follow-up: Return in about 2 weeks (around 06/02/2021) for LROB.  No orders of the defined types were placed in this encounter.  Jacklyn Shell DNP, CNM 05/19/2021 11:26 AM

## 2021-05-22 NOTE — L&D Delivery Note (Signed)
OB/GYN Faculty Practice Delivery Note  Katherine Fletcher is a 36 y.o. G2P1001 s/p SVD at [redacted]w[redacted]d. She was admitted for SOL/TOLAC.   ROM: 6h 35m with clear fluid GBS Status: negative Maximum Maternal Temperature: 98.7  Labor Progress: Pt present after SOL at home, progress to complete without augmentation.   Delivery Date/Time: 06/15/2021 @ 1039 Delivery: Called to room and patient was complete and pushing. After 1 hour of second stage labor, a viable NBM was delivered.   Head delivered ROA. No nuchal cord present. Shoulder and body delivered in usual fashion. Infant with spontaneous cry, placed on mother's abdomen, dried and stimulated. Cord clamped x 2 after 1-3 minute delay, and cut by Legrand Como, pt partner. Cord blood drawn. Placenta delivered spontaneously with gentle cord traction. Fundus firm with massage and 40 units of Pitocin diluted in 1000 cc LR was infused rapidly IV. Labia, perineum, vagina, and cervix inspected inspected with 2nd degree perineum.   Placenta: intact, 3 VC, and to L&D Complications: None Lacerations: 2nd degree perineum  EBL: 200 mL  Analgesia: Epidural   Postpartum Planning VBAC: admit to PP with routine orders  Infant: NBM   APGARs 8   9 g, declined circumsion Contraception: condoms or pt vasectomy

## 2021-05-24 DIAGNOSIS — F603 Borderline personality disorder: Secondary | ICD-10-CM | POA: Diagnosis not present

## 2021-05-24 DIAGNOSIS — F4310 Post-traumatic stress disorder, unspecified: Secondary | ICD-10-CM | POA: Diagnosis not present

## 2021-05-30 ENCOUNTER — Encounter: Payer: Self-pay | Admitting: Women's Health

## 2021-05-30 ENCOUNTER — Encounter: Payer: Self-pay | Admitting: Obstetrics & Gynecology

## 2021-05-30 DIAGNOSIS — Z029 Encounter for administrative examinations, unspecified: Secondary | ICD-10-CM

## 2021-05-31 DIAGNOSIS — F4310 Post-traumatic stress disorder, unspecified: Secondary | ICD-10-CM | POA: Diagnosis not present

## 2021-05-31 DIAGNOSIS — F603 Borderline personality disorder: Secondary | ICD-10-CM | POA: Diagnosis not present

## 2021-06-02 ENCOUNTER — Other Ambulatory Visit: Payer: Self-pay

## 2021-06-02 ENCOUNTER — Other Ambulatory Visit (HOSPITAL_COMMUNITY)
Admission: RE | Admit: 2021-06-02 | Discharge: 2021-06-02 | Disposition: A | Discharge status: Home / Self Care | Payer: Medicaid Other | Source: Ambulatory Visit | Attending: Obstetrics & Gynecology | Admitting: Obstetrics & Gynecology

## 2021-06-02 ENCOUNTER — Ambulatory Visit (INDEPENDENT_AMBULATORY_CARE_PROVIDER_SITE_OTHER): Payer: Medicaid Other | Admitting: Obstetrics & Gynecology

## 2021-06-02 ENCOUNTER — Encounter: Payer: Self-pay | Admitting: Obstetrics & Gynecology

## 2021-06-02 VITALS — BP 118/66 | HR 65 | Wt 226.0 lb

## 2021-06-02 DIAGNOSIS — Z3483 Encounter for supervision of other normal pregnancy, third trimester: Secondary | ICD-10-CM

## 2021-06-02 DIAGNOSIS — Z3A37 37 weeks gestation of pregnancy: Secondary | ICD-10-CM

## 2021-06-02 DIAGNOSIS — Z98891 History of uterine scar from previous surgery: Secondary | ICD-10-CM

## 2021-06-02 NOTE — Progress Notes (Signed)
° °  LOW-RISK PREGNANCY VISIT Patient name: Katherine Fletcher MRN [id number]  Date of birth: 1986/02/17 Chief Complaint:   Routine Prenatal Visit  History of Present Illness:   Katherine Fletcher is a 36 y.o. G61P1001 female at [redacted]w[redacted]d with an Estimated Date of Delivery: 06/23/21 being seen today for ongoing management of a low-risk pregnancy.  Depression screen [facility] 2/9 03/28/2021 12/09/2020 10/25/2020 05/26/2020  Decreased Interest 1 1 1 3   Down, Depressed, Hopeless 2 1 1 3   PHQ - 2 Score 3 2 2 6   Altered sleeping 3 0 2 1  Tired, decreased energy 3 3 3 1   Change in appetite 0 1 1 2   Feeling bad or failure about yourself  1 2 2 3   Trouble concentrating 3 1 1  0  Moving slowly or fidgety/restless 0 0 0 3  Suicidal thoughts 0 0 0 0  PHQ-9 Score 13 9 11 16   Difficult doing work/chores - - - Somewhat difficult    Today she reports no complaints. Contractions: Irritability. Vag. Bleeding: None.  Movement: Present. denies leaking of fluid. Review of Systems:   Pertinent items are noted in HPI Denies abnormal vaginal discharge w/ itching/odor/irritation, headaches, visual changes, shortness of breath, chest pain, abdominal pain, severe nausea/vomiting, or problems with urination or bowel movements unless otherwise stated above. Pertinent History Reviewed:  Reviewed past medical,surgical, social, obstetrical and family history.  Reviewed problem list, medications and allergies. Physical Assessment:   Vitals:   06/02/21 0830  BP: 118/66  Pulse: 65  Weight: 226 lb (102.5 kg)  Body mass index is 33.37 kg/m.        Physical Examination:   General appearance: Well appearing, and in no distress  Mental status: Alert, oriented to person, place, and time  Skin: Warm & dry  Cardiovascular: Normal heart rate noted  Respiratory: Normal respiratory effort, no distress  Abdomen: Soft, gravid, nontender  Pelvic: Cervical exam deferred         Extremities: Edema: None  Fetal Status:     Movement: Present     Chaperone: n/a    No results found for this or any previous visit (from the past 24 hour(s)).  Assessment & Plan:  1) Low-risk pregnancy G2P1001 at [redacted]w[redacted]d with an Estimated Date of Delivery: 06/23/21   2) Previous C section, TOLAC papers are signed   Meds: No orders of the defined types were placed in this encounter.  Labs/procedures today:   Plan:  Continue routine obstetrical care  Next visit: prefers in person    Reviewed: Preterm labor symptoms and general obstetric precautions including but not limited to vaginal bleeding, contractions, leaking of fluid and fetal movement were reviewed in detail with the patient.  All questions were answered. Has home bp cuff. Rx faxed to . Check bp weekly, let know if >140/90.   Follow-up: Return in about 1 week (around 06/09/2021) for LROB.  Orders Placed This Encounter  Procedures   Culture, beta strep (group b only)    , MD 06/02/2021 8:54 AM

## 2021-06-03 LAB — CERVICOVAGINAL ANCILLARY ONLY
Chlamydia: NEGATIVE
Comment: NEGATIVE
Comment: NORMAL
Neisseria Gonorrhea: NEGATIVE

## 2021-06-06 LAB — CULTURE, BETA STREP (GROUP B ONLY): Strep Gp B Culture: NEGATIVE

## 2021-06-07 DIAGNOSIS — F603 Borderline personality disorder: Secondary | ICD-10-CM | POA: Diagnosis not present

## 2021-06-07 DIAGNOSIS — F4310 Post-traumatic stress disorder, unspecified: Secondary | ICD-10-CM | POA: Diagnosis not present

## 2021-06-09 ENCOUNTER — Other Ambulatory Visit: Payer: Self-pay

## 2021-06-09 ENCOUNTER — Ambulatory Visit (INDEPENDENT_AMBULATORY_CARE_PROVIDER_SITE_OTHER): Payer: Medicaid Other | Admitting: Women's Health

## 2021-06-09 ENCOUNTER — Encounter: Payer: Self-pay | Admitting: Women's Health

## 2021-06-09 VITALS — BP 112/72 | HR 79 | Wt 229.0 lb

## 2021-06-09 DIAGNOSIS — Z3480 Encounter for supervision of other normal pregnancy, unspecified trimester: Secondary | ICD-10-CM

## 2021-06-09 DIAGNOSIS — Z3483 Encounter for supervision of other normal pregnancy, third trimester: Secondary | ICD-10-CM

## 2021-06-09 NOTE — Patient Instructions (Signed)
Lucillie, thank you for choosing our office today! We appreciate the opportunity to meet your healthcare needs. You may receive a short survey by mail, e-mail, or through MyChart. If you are happy with your care we would appreciate if you could take just a few minutes to complete the survey questions. We read all of your comments and take your feedback very seriously. Thank you again for choosing our office.  [facility] [place] ([address] [place], [place] [address]) Entrance C, located off of [address] Free 24/7 valet parking   CLASSES: Go to [email or web address] to register for classes (childbirth, breastfeeding, waterbirth, infant CPR, daddy bootcamp, etc.)  Call the office ([phone]) or go to [facility] if: You begin to have strong, frequent contractions Your water breaks.  Sometimes it is a big gush of fluid, sometimes it is just a trickle that keeps getting your panties wet or running down your legs You have vaginal bleeding.  It is normal to have a small amount of spotting if your cervix was checked.  You don't feel your baby moving like normal.  If you don't, get you something to eat and drink and lay down and focus on feeling your baby move.   If your baby is still not moving like normal, you should call the office or go to [facility].  Call the office ([phone]) or go to [facility] for these signs of pre-eclampsia: Severe headache that does not go away with Tylenol Visual changes- seeing spots, double, blurred vision Pain under your right breast or upper abdomen that does not go away with Tums or heartburn medicine Nausea and/or vomiting Severe swelling in your hands, feet, and face   [place] Pediatricians/Family Doctors [facility] ([facility]): [address], [phone]           [facility]: [address], [phone]                 [facility] ([facility]): [address], [phone] (call to ask if accepting patients) [facility]: [address], [place], [phone]    [place] Pediatricians/Family Doctors [facility] ([facility]): [address], [phone] [facility]: [address], [phone] [facility] [place]: [address], [phone]  [place] [facility]  [facility] ([facility]): [phone] [facility]: [address], [phone]   [place] Family Doctors [facility]: [address], [phone]  [place] [facility]  [facility]: [address], [phone]  Home Blood Pressure Monitoring for Patients   Your provider has recommended that you check your blood pressure (BP) at least once a week at home. If you do not have a blood pressure cuff at home, one will be provided for you. Contact your provider if you have not received your monitor within 1 week.   Helpful Tips for Accurate Home Blood Pressure Checks  Don't smoke, exercise, or drink caffeine 30 minutes before checking your BP Use the restroom before checking your BP (a full bladder can raise your pressure) Relax in a comfortable upright chair Feet on the ground Left arm resting comfortably on a flat surface at the level of your heart Legs uncrossed Back supported Sit quietly and don't talk Place the cuff on your bare arm Adjust snuggly, so that only two fingertips   can fit between your skin and the top of the cuff Check 2 readings separated by at least one minute Keep a log of your BP readings For a visual, please reference this diagram: http://ccnc.care/bpdiagram  Provider Name: [facility]     Phone: [phone]  Zone 1: ALL CLEAR  Continue to monitor your symptoms:  BP reading is less than 140 (top number) or less than 90 (bottom number)  No right  upper stomach pain No headaches or seeing spots No feeling nauseated or throwing up No swelling in face and hands  Zone 2: CAUTION Call your doctor's office for any of the following:  BP reading is greater than 140 (top number) or greater than 90 (bottom number)  Stomach pain under your ribs in the middle or right side Headaches or seeing spots Feeling nauseated or throwing up Swelling in face and hands  Zone 3: EMERGENCY  Seek immediate medical care if you have any of the following:  BP reading is greater than160 (top number) or greater than 110 (bottom number) Severe headaches not improving with Tylenol Serious difficulty catching your breath Any worsening symptoms from Zone 2   Braxton Hicks Contractions Contractions of the uterus can occur throughout pregnancy, but they are not always a sign that you are in labor. You may have practice contractions called Braxton Hicks contractions. These false labor contractions are sometimes confused with true labor. What are Braxton Hicks contractions? Braxton Hicks contractions are tightening movements that occur in the muscles of the uterus before labor. Unlike true labor contractions, these contractions do not result in opening (dilation) and thinning of the cervix. Toward the end of pregnancy (32-34 weeks), Braxton Hicks contractions can happen more often and may become stronger. These contractions are sometimes difficult to tell apart from true labor because they can be very uncomfortable. You should not feel embarrassed if you go to the hospital with false labor. Sometimes, the only way to tell if you are in true labor is for your health care provider to look for changes in the cervix. The health care provider will do a physical exam and may monitor your contractions. If you are not in true labor, the exam should show that your cervix is not dilating and your water has not broken. If there are no other health problems associated with your  pregnancy, it is completely safe for you to be sent home with false labor. You may continue to have Braxton Hicks contractions until you go into true labor. How to tell the difference between true labor and false labor True labor Contractions last 30-70 seconds. Contractions become very regular. Discomfort is usually felt in the top of the uterus, and it spreads to the lower abdomen and low back. Contractions do not go away with walking. Contractions usually become more intense and increase in frequency. The cervix dilates and gets thinner. False labor Contractions are usually shorter and not as strong as true labor contractions. Contractions are usually irregular. Contractions are often felt in the front of the lower abdomen and in the groin. Contractions may go away when you walk around or change positions while lying down. Contractions get weaker and are shorter-lasting as time goes on. The cervix usually does not dilate or become thin. Follow these instructions at home:  Take over-the-counter and prescription medicines only as told by your health care provider. Keep up with your usual exercises and follow other instructions from your health care provider. Eat and drink lightly if you think   you are going into labor. If Braxton Hicks contractions are making you uncomfortable: Change your position from lying down or resting to walking, or change from walking to resting. Sit and rest in a tub of warm water. Drink enough fluid to keep your urine pale yellow. Dehydration may cause these contractions. Do slow and deep breathing several times an hour. Keep all follow-up prenatal visits as told by your health care provider. This is important. Contact a health care provider if: You have a fever. You have continuous pain in your abdomen. Get help right away if: Your contractions become stronger, more regular, and closer together. You have fluid leaking or gushing from your vagina. You pass  blood-tinged mucus (bloody show). You have bleeding from your vagina. You have low back pain that you never had before. You feel your baby's head pushing down and causing pelvic pressure. Your baby is not moving inside you as much as it used to. Summary Contractions that occur before labor are called Braxton Hicks contractions, false labor, or practice contractions. Braxton Hicks contractions are usually shorter, weaker, farther apart, and less regular than true labor contractions. True labor contractions usually become progressively stronger and regular, and they become more frequent. Manage discomfort from Braxton Hicks contractions by changing position, resting in a warm bath, drinking plenty of water, or practicing deep breathing. This information is not intended to replace advice given to you by your health care provider. Make sure you discuss any questions you have with your health care provider. Document Revised: 04/20/2017 Document Reviewed: 09/21/2016 Elsevier Patient Education  2020 Elsevier Inc.   

## 2021-06-09 NOTE — Progress Notes (Signed)
LOW-RISK PREGNANCY VISIT Patient name: Katherine Fletcher MRN [id number]  Date of birth: 11-29-1985 Chief Complaint:   Routine Prenatal Visit  History of Present Illness:   Katherine Fletcher is a 36 y.o. G31P1001 female at [redacted]w[redacted]d with an Estimated Date of Delivery: 06/23/21 being seen today for ongoing management of a low-risk pregnancy.   Today she reports  hip pain . Contractions: Irregular. Vag. Bleeding: Scant.  Movement: Present. denies leaking of fluid.  Depression screen [facility] 2/9 03/28/2021 12/09/2020 10/25/2020 05/26/2020  Decreased Interest 1 1 1 3   Down, Depressed, Hopeless 2 1 1 3   PHQ - 2 Score 3 2 2 6   Altered sleeping 3 0 2 1  Tired, decreased energy 3 3 3 1   Change in appetite 0 1 1 2   Feeling bad or failure about yourself  1 2 2 3   Trouble concentrating 3 1 1  0  Moving slowly or fidgety/restless 0 0 0 3  Suicidal thoughts 0 0 0 0  PHQ-9 Score 13 9 11 16   Difficult doing work/chores - - - Somewhat difficult     GAD 7 : Generalized Anxiety Score 03/28/2021 12/09/2020 10/25/2020  Nervous, Anxious, on Edge 1 1 2   Control/stop worrying 1 1 2   Worry too much - different things 0 1 2  Trouble relaxing 2 2 2   Restless 2 0 1  Easily annoyed or irritable 1 3 3   Afraid - awful might happen 0 0 0  Total GAD 7 Score 7 8 12       Review of Systems:   Pertinent items are noted in HPI Denies abnormal vaginal discharge w/ itching/odor/irritation, headaches, visual changes, shortness of breath, chest pain, abdominal pain, severe nausea/vomiting, or problems with urination or bowel movements unless otherwise stated above. Pertinent History Reviewed:  Reviewed past medical,surgical, social, obstetrical and family history.  Reviewed problem list, medications and allergies. Physical Assessment:   Vitals:   06/09/21 1014  BP: 112/72  Pulse: 79  Weight: 229 lb (103.9 kg)  Body mass index is 33.82 kg/m.        Physical Examination:   General appearance: Well appearing, and in no  distress  Mental status: Alert, oriented to person, place, and time  Skin: Warm & dry  Cardiovascular: Normal heart rate noted  Respiratory: Normal respiratory effort, no distress  Abdomen: Soft, gravid, nontender  Pelvic: Cervical exam deferred         Extremities: Edema: Trace  Fetal Status: Fetal Heart Rate (bpm): 150 Fundal Height: 36 cm Movement: Present Presentation: Vertex by Leopold's  Chaperone: N/A   No results found for this or any previous visit (from the past 24 hour(s)).  Assessment & Plan:  1) Low-risk pregnancy G2P1001 at [redacted]w[redacted]d with an Estimated Date of Delivery: 06/23/21   2) Prev c/s, wants TOLAC   Meds: No orders of the defined types were placed in this encounter.  Labs/procedures today: none  Plan:  Continue routine obstetrical care  Next visit: prefers in person    Reviewed: Term labor symptoms and general obstetric precautions including but not limited to vaginal bleeding, contractions, leaking of fluid and fetal movement were reviewed in detail with the patient.  All questions were answered. Does have home bp cuff. Office bp cuff given: not applicable. Check bp weekly, let know if consistently >140 and/or >90.  Follow-up: Return for As scheduled.  Future Appointments  Date Time Provider Department Center  06/16/2021 11:50 AM 13/11/2020, DO [facility]    No orders  of the defined types were placed in this encounter.  Cheral Marker CNM, [facility] 06/09/2021 10:47 AM

## 2021-06-14 ENCOUNTER — Other Ambulatory Visit: Payer: Self-pay

## 2021-06-14 ENCOUNTER — Encounter (HOSPITAL_COMMUNITY): Payer: Self-pay | Admitting: Obstetrics and Gynecology

## 2021-06-14 ENCOUNTER — Inpatient Hospital Stay (HOSPITAL_COMMUNITY)
Admission: AD | Admit: 2021-06-14 | Discharge: 2021-06-17 | DRG: 807 | Disposition: A | Discharge status: Home / Self Care | Payer: Medicaid Other | Attending: Obstetrics and Gynecology | Admitting: Obstetrics and Gynecology

## 2021-06-14 DIAGNOSIS — Z3A38 38 weeks gestation of pregnancy: Secondary | ICD-10-CM

## 2021-06-14 DIAGNOSIS — R200 Anesthesia of skin: Secondary | ICD-10-CM | POA: Diagnosis not present

## 2021-06-14 DIAGNOSIS — F418 Other specified anxiety disorders: Secondary | ICD-10-CM | POA: Diagnosis present

## 2021-06-14 DIAGNOSIS — O34219 Maternal care for unspecified type scar from previous cesarean delivery: Principal | ICD-10-CM | POA: Diagnosis present

## 2021-06-14 DIAGNOSIS — O99893 Other specified diseases and conditions complicating puerperium: Secondary | ICD-10-CM | POA: Diagnosis not present

## 2021-06-14 DIAGNOSIS — O26893 Other specified pregnancy related conditions, third trimester: Secondary | ICD-10-CM | POA: Diagnosis not present

## 2021-06-14 DIAGNOSIS — Z20822 Contact with and (suspected) exposure to covid-19: Secondary | ICD-10-CM | POA: Diagnosis present

## 2021-06-14 DIAGNOSIS — Z3490 Encounter for supervision of normal pregnancy, unspecified, unspecified trimester: Secondary | ICD-10-CM

## 2021-06-14 DIAGNOSIS — Z98891 History of uterine scar from previous surgery: Secondary | ICD-10-CM

## 2021-06-14 DIAGNOSIS — O34211 Maternal care for low transverse scar from previous cesarean delivery: Secondary | ICD-10-CM | POA: Diagnosis not present

## 2021-06-14 LAB — CBC
HCT: 37.1 % (ref 36.0–46.0)
Hemoglobin: 12.9 g/dL (ref 12.0–15.0)
MCH: 33.8 pg (ref 26.0–34.0)
MCHC: 34.8 g/dL (ref 30.0–36.0)
MCV: 97.1 fL (ref 80.0–100.0)
Platelets: 152 10*3/uL (ref 150–400)
RBC: 3.82 MIL/uL — ABNORMAL LOW (ref 3.87–5.11)
RDW: 13.5 % (ref 11.5–15.5)
WBC: 10.7 10*3/uL — ABNORMAL HIGH (ref 4.0–10.5)
nRBC: 0 % (ref 0.0–0.2)

## 2021-06-14 MED ORDER — ONDANSETRON HCL 4 MG/2ML IJ SOLN: 4.0000 mg | Freq: Four times a day (QID) | INTRAMUSCULAR | Status: DC | PRN

## 2021-06-14 MED ORDER — OXYTOCIN-SODIUM CHLORIDE 30-0.9 UT/500ML-% IV SOLN
2.5000 [IU]/h | INTRAVENOUS | Status: DC
  Administered 2021-06-15: 11:00:00 2.5 [IU]/h via INTRAVENOUS
  Filled 2021-06-14: qty 500

## 2021-06-14 MED ORDER — LIDOCAINE HCL (PF) 1 % IJ SOLN
30.0000 mL | INTRAMUSCULAR | Status: AC | PRN
  Administered 2021-06-15: 11:00:00 30 mL via SUBCUTANEOUS
  Filled 2021-06-14: qty 30

## 2021-06-14 MED ORDER — OXYTOCIN BOLUS FROM INFUSION
333.0000 mL | Freq: Once | INTRAVENOUS | Status: AC
  Administered 2021-06-15: 11:00:00 333 mL via INTRAVENOUS

## 2021-06-14 MED ORDER — ACETAMINOPHEN 325 MG PO TABS: 650.0000 mg | ORAL_TABLET | ORAL | Status: DC | PRN

## 2021-06-14 MED ORDER — OXYCODONE-ACETAMINOPHEN 5-325 MG PO TABS: 2.0000 | ORAL_TABLET | ORAL | Status: DC | PRN

## 2021-06-14 MED ORDER — SOD CITRATE-CITRIC ACID 500-334 MG/5ML PO SOLN: 30.0000 mL | ORAL | Status: DC | PRN

## 2021-06-14 MED ORDER — LACTATED RINGERS IV SOLN: 500.0000 mL | INTRAVENOUS | Status: DC | PRN

## 2021-06-14 MED ORDER — OXYCODONE-ACETAMINOPHEN 5-325 MG PO TABS: 1.0000 | ORAL_TABLET | ORAL | Status: DC | PRN

## 2021-06-14 MED ORDER — LACTATED RINGERS IV SOLN: INTRAVENOUS | Status: DC

## 2021-06-14 NOTE — H&P (Addendum)
OBSTETRIC ADMISSION HISTORY AND PHYSICAL  Katherine Fletcher is a 36 y.o. adult G2P1001 with IUP at [redacted]w[redacted]d by LMP c/w 8-wk U/S presenting for SOL/TOLAC. Patient with reported CTXs that have increased in severity over the past 1-2 days found to be dilated to 6 cm in the MAU this evening. She desires a TOLAC, has signed papers and verified this during discussion this evening. She reports +FMs, no LOF, no VB, no blurry vision, headaches, peripheral edema, or RUQ pain.  She plans on breast feeding. She requests condoms for birth control with plan for eventual partner vasectomy.  She [facility].   Dating: By LMP --->  Estimated Date of Delivery: 06/23/21  Sono:   @[redacted]w[redacted]d , CWD, normal anatomy, breech presentation, posterior lie, 279g, 47% EFW  Prenatal History/Complications:  - History of C/S d/t NRFHR (TOLAC consent signed 03/28/21)  - Depression - Anxiety - PTSD - Bipolar disorder - Borderline personality disorder  Past Medical History: Past Medical History:  Diagnosis Date   Allergy    Anemia    Anxiety    Phreesia 05/23/2020   Cesarean delivery delivered 11/21/2019   Depression    Depression    Phreesia 05/23/2020   History of cesarean section 11/04/2019   Migraine    PTSD (post-traumatic stress disorder)    Vaginal Pap smear, abnormal     Past Surgical History: Past Surgical History:  Procedure Laterality Date   BREAST REDUCTION SURGERY     BREAST SURGERY N/A    Phreesia 05/23/2020   CESAREAN SECTION N/A    Phreesia 05/23/2020   COSMETIC SURGERY N/A    Phreesia 05/23/2020   TONSILLECTOMY     WISDOM TOOTH EXTRACTION      Obstetrical History: OB History     Gravida  2   Para  1   Term  1   Preterm      AB      Living  1      SAB      IAB      Ectopic      Multiple      Live Births  1           Social History Social History   Socioeconomic History   Marital status: Significant Other    Spouse name: Not on file    Number of children: Not on file   Years of education: Not on file   Highest education level: Not on file  Occupational History   Not on file  Tobacco Use   Smoking status: Never    Passive exposure: Current   Smokeless tobacco: Never  Vaping Use   Vaping Use: Never used  Substance and Sexual Activity   Alcohol use: Not Currently   Drug use: No   Sexual activity: Yes    Birth control/protection: None  Other Topics Concern   Not on file  Social History Narrative   ** Merged History Encounter **       Social Determinants of Health   Financial Resource Strain: Medium Risk   Difficulty of Paying Living Expenses: Somewhat hard  Food Insecurity: No Food Insecurity   Worried About 07/21/2020 in the Last Year: Never true   Programme researcher, broadcasting/film/video in the Last Year: Never true  Transportation Needs: No Transportation Needs   Lack of Transportation (Medical): No   Lack of Transportation (Non-Medical): No  Physical Activity: Insufficiently Active   Days of Exercise per Week: 3  days   Minutes of Exercise per Session: 30 min  Stress: Stress Concern Present   Feeling of Stress : Very much  Social Connections: Socially Isolated   Frequency of Communication with Friends and Family: Three times a week   Frequency of Social Gatherings with Friends and Family: Once a week   Attends Religious Services: Never   Database administratorActive Member of Clubs or Organizations: No   Attends Engineer, structuralClub or Organization Meetings: Never   Marital Status: Divorced    Family History: Family History  Problem Relation Age of Onset   Obesity Mother    Depression Mother    Arthritis Mother    Obesity Father    Rheum arthritis Father    Hypothyroidism Father    Obesity Sister    Polycystic ovary syndrome Sister    Cancer Maternal Grandmother    Heart disease Maternal Grandfather    Hypothyroidism Paternal Grandmother    Arthritis Paternal Grandmother    Heart attack Paternal Grandfather    Arthritis Paternal Grandfather      Allergies: Allergies  Allergen Reactions   Cat Hair Extract Anaphylaxis    Anxiety, rash, swelling, itching, nausea, shortness of breath   Other Hives, Shortness Of Breath, Itching, Nausea Only, Swelling, Anxiety, Rash and Other (See Comments)    Opiates-GI Upset, constipation, itching   Latex Rash    Medications Prior to Admission  Medication Sig Dispense Refill Last Dose   cholecalciferol (VITAMIN D3) 25 MCG (1000 UNIT) tablet       Cobalamin Combinations (B-12) [phone] MCG SUBL       Ferrous Sulfate Dried (SLOW IRON PO) Take by mouth.      Prenatal MV & Min w/FA-DHA (PRENATAL GUMMIES PO) Take by mouth.        Review of Systems  All systems reviewed and negative except as stated in HPI  Blood pressure 121/69, pulse 80, temperature 98.7 F (37.1 C), height 5\' 9"  (1.753 m), weight 103 kg, last menstrual period 09/16/2020, SpO2 100 %.  General appearance: Well-appearing, alert, cooperative, appears stated age, and no distress walking around the room swaying Lungs: Normal work of breathing Heart: Regular rate  Abdomen: Soft, non-tender, gravid Pelvic: Deferred Extremities: No sign of DVT Presentation:  Vertex Fetal monitoring: Baseline: 145 bpm, Variability: Good {> 6 bpm), Accelerations: Reactive, and Decelerations: Absent Uterine activity: Frequency: Every 5-6 minutes Dilation: 6 Effacement (%): 80 Exam by:: Caprice Renshawebra Callaway, RN   Prenatal labs: ABO, Rh: AB/Positive/-- (07/21 1217) Antibody: Negative (11/09 0913) Rubella: 3.46 (07/21 1217) RPR: Non Reactive (11/09 0913)  HBsAg: Negative (07/21 1217)  HIV: Non Reactive (11/09 0913)  GBS: Negative/-- (01/12 1115)  2 hr Glucola normal Genetic screening low-risk Anatomy US normal  Prenatal Transfer Tool  Maternal Diabetes: No Genetic Screening: Normal Maternal Ultrasounds/Referrals: Normal Fetal Ultrasounds or other Referrals:  None Maternal Substance Abuse:  No Significant Maternal Medications:   None Significant Maternal Lab Results: Group B Strep negative  No results found for this or any previous visit (from the past 24 hour(s)).  Patient Active Problem List   Diagnosis Date Noted   Encounter for supervision of normal pregnancy, antepartum 12/09/2020   Depression/anxiety/PTSD/Bipolar/BPD 12/09/2020   History of cesarean section 11/04/2019   Mood disorder (HCC) 09/29/2019   Vegetarian diet 06/12/2019   Allergic rhinitis due to animal hair and dander 02/27/2018   Chronic sinusitis 02/05/2018    Assessment/Plan:  Katherine Fletcher is a 36 y.o. G2P1001 at 2462w5d here for SOL/TOLAC.  #Labor: Admit to L&D for  anticipated TOLAC. Start IVF, clear liquid diet, fetal monitoring. Discussed TOLAC risks including possible uterine rupture requiring emergent C/S with patient, who is understanding and wishes to proceed with TOLAC. Consent previously signed 03/28/21. Will monitor at this time and consider AROM +/- Pitocin pending patient's labor progression.  #Pain: As desired, considering nitrous and epidural depending on labor course #FWB: Category I #ID: N/A, GBS negative #MOF: Breast #MOC: Condoms > vasectomy #Circ: Declined  Raylene Everts, MD  06/14/2021, 11:22 PM  GME ATTESTATION:  I saw and evaluated the patient. I agree with the findings and the plan of care as documented in the residents note. I have made changes to documentation as necessary.  Favorable SVE on admission, contractions every 5-6 minutes, becoming more painful. Will continue expectant management for now and augment with AROM/Pitocin as needed.   Risks of TOLAC discussed with patient as noted above. Patient voiced understanding of these risks and wishes to proceed with TOLAC. Consent previously signed 03/28/21.   Evalina Field, MD OB Fellow, Faculty [facility], Center for [facility] 06/15/2021 12:36 AM

## 2021-06-14 NOTE — MAU Note (Signed)
Ctxs since yesterday about 1300. Ctxs every for 24hrs. Denies LOF. Some bloody show. No recent sve. Good FM

## 2021-06-15 ENCOUNTER — Encounter (HOSPITAL_COMMUNITY): Payer: Self-pay | Admitting: Obstetrics and Gynecology

## 2021-06-15 ENCOUNTER — Inpatient Hospital Stay (HOSPITAL_COMMUNITY): Payer: Medicaid Other | Admitting: Anesthesiology

## 2021-06-15 DIAGNOSIS — O34211 Maternal care for low transverse scar from previous cesarean delivery: Secondary | ICD-10-CM | POA: Diagnosis not present

## 2021-06-15 DIAGNOSIS — Z3A38 38 weeks gestation of pregnancy: Secondary | ICD-10-CM | POA: Diagnosis not present

## 2021-06-15 LAB — RESP PANEL BY RT-PCR (FLU A&B, COVID) ARPGX2
Influenza A by PCR: NEGATIVE
Influenza B by PCR: NEGATIVE
SARS Coronavirus 2 by RT PCR: NEGATIVE

## 2021-06-15 LAB — RPR: RPR Ser Ql: NONREACTIVE

## 2021-06-15 LAB — TYPE AND SCREEN
ABO/RH(D): AB POS
Antibody Screen: NEGATIVE

## 2021-06-15 MED ORDER — WITCH HAZEL-GLYCERIN EX PADS
1.0000 "application " | MEDICATED_PAD | CUTANEOUS | Status: DC | PRN
  Administered 2021-06-15: 1 via TOPICAL

## 2021-06-15 MED ORDER — SIMETHICONE 80 MG PO CHEW: 80.0000 mg | CHEWABLE_TABLET | ORAL | Status: DC | PRN

## 2021-06-15 MED ORDER — PRENATAL MULTIVITAMIN CH
1.0000 | ORAL_TABLET | Freq: Every day | ORAL | Status: DC
  Administered 2021-06-16 – 2021-06-17 (×2): 1 via ORAL
  Filled 2021-06-15 (×2): qty 1

## 2021-06-15 MED ORDER — ZOLPIDEM TARTRATE 5 MG PO TABS: 5.0000 mg | ORAL_TABLET | Freq: Every evening | ORAL | Status: DC | PRN

## 2021-06-15 MED ORDER — FENTANYL-BUPIVACAINE-NACL 0.5-0.125-0.9 MG/250ML-% EP SOLN
12.0000 mL/h | EPIDURAL | Status: DC | PRN
  Administered 2021-06-15: 02:00:00 12 mL/h via EPIDURAL
  Filled 2021-06-15: qty 250

## 2021-06-15 MED ORDER — PHENYLEPHRINE 40 MCG/ML (10ML) SYRINGE FOR IV PUSH (FOR BLOOD PRESSURE SUPPORT): 80.0000 ug | PREFILLED_SYRINGE | INTRAVENOUS | Status: DC | PRN

## 2021-06-15 MED ORDER — DIPHENHYDRAMINE HCL 25 MG PO CAPS: 25.0000 mg | ORAL_CAPSULE | Freq: Four times a day (QID) | ORAL | Status: DC | PRN

## 2021-06-15 MED ORDER — BENZOCAINE-MENTHOL 20-0.5 % EX AERO: 1.0000 "application " | INHALATION_SPRAY | CUTANEOUS | Status: DC | PRN

## 2021-06-15 MED ORDER — EPHEDRINE 5 MG/ML INJ: 10.0000 mg | INTRAVENOUS | Status: DC | PRN

## 2021-06-15 MED ORDER — IBUPROFEN 600 MG PO TABS
600.0000 mg | ORAL_TABLET | Freq: Four times a day (QID) | ORAL | Status: DC
  Administered 2021-06-15 – 2021-06-17 (×8): 600 mg via ORAL
  Filled 2021-06-15 (×8): qty 1

## 2021-06-15 MED ORDER — ONDANSETRON HCL 4 MG PO TABS: 4.0000 mg | ORAL_TABLET | ORAL | Status: DC | PRN

## 2021-06-15 MED ORDER — TETANUS-DIPHTH-ACELL PERTUSSIS 5-2.5-18.5 LF-MCG/0.5 IM SUSY: 0.5000 mL | PREFILLED_SYRINGE | Freq: Once | INTRAMUSCULAR | Status: DC

## 2021-06-15 MED ORDER — DIBUCAINE (PERIANAL) 1 % EX OINT: 1.0000 "application " | TOPICAL_OINTMENT | CUTANEOUS | Status: DC | PRN

## 2021-06-15 MED ORDER — LACTATED RINGERS IV SOLN
500.0000 mL | Freq: Once | INTRAVENOUS | Status: AC
  Administered 2021-06-15: 02:00:00 500 mL via INTRAVENOUS

## 2021-06-15 MED ORDER — LIDOCAINE HCL (PF) 1 % IJ SOLN
INTRAMUSCULAR | Status: DC | PRN
  Administered 2021-06-15: 4 mL via EPIDURAL
  Administered 2021-06-15: 6 mL via EPIDURAL

## 2021-06-15 MED ORDER — COCONUT OIL OIL: 1.0000 "application " | TOPICAL_OIL | Status: DC | PRN

## 2021-06-15 MED ORDER — ACETAMINOPHEN 325 MG PO TABS: 650.0000 mg | ORAL_TABLET | ORAL | Status: DC | PRN

## 2021-06-15 MED ORDER — SENNOSIDES-DOCUSATE SODIUM 8.6-50 MG PO TABS
2.0000 | ORAL_TABLET | Freq: Every day | ORAL | Status: DC
  Administered 2021-06-16 – 2021-06-17 (×2): 2 via ORAL
  Filled 2021-06-15 (×2): qty 2

## 2021-06-15 MED ORDER — DIPHENHYDRAMINE HCL 50 MG/ML IJ SOLN: 12.5000 mg | INTRAMUSCULAR | Status: DC | PRN

## 2021-06-15 MED ORDER — ONDANSETRON HCL 4 MG/2ML IJ SOLN: 4.0000 mg | INTRAMUSCULAR | Status: DC | PRN

## 2021-06-15 NOTE — Anesthesia Postprocedure Evaluation (Signed)
Anesthesia Post Note  Patient: Marylin Lathon  Procedure(s) Performed: AN AD HOC LABOR EPIDURAL     Patient location during evaluation: Mother Baby Anesthesia Type: Epidural Level of consciousness: awake and alert and oriented Pain management: satisfactory to patient Vital Signs Assessment: post-procedure vital signs reviewed and stable Respiratory status: respiratory function stable Cardiovascular status: stable Postop Assessment: no headache, no backache, epidural receding, patient able to bend at knees, no signs of nausea or vomiting and adequate PO intake Anesthetic complications: no   No notable events documented.  Last Vitals:  Vitals:   06/15/21 1424 06/15/21 1753  BP: (!) 96/59 99/60  Pulse: 64 81  Resp:  18  Temp: 36.7 C 37.2 C  SpO2:      Last Pain:  Vitals:   06/15/21 1753  TempSrc: Oral  PainSc:    Pain Goal: Patients Stated Pain Goal: 0 (06/14/21 2257)                 Karleen Dolphin

## 2021-06-15 NOTE — Progress Notes (Signed)
Katherine Fletcher is a 36 y.o. G2P1001 at [redacted]w[redacted]d by LMP admitted for SOL/TOLAC.  Subjective: Doing well, comfortable. Was able to get some sleep and relax. States that she is agreeable to AROM at this time due to previous reported forebag.   Objective: BP 106/61    Pulse 83    Temp 98.7 F (37.1 C) (Oral)    Ht 5\' 9"  (1.753 m)    Wt 103 kg    LMP 09/16/2020 (Exact Date)    SpO2 99%    BMI 33.52 kg/m  I/O last 3 completed shifts: In: -  Out: 625 [Urine:625] No intake/output data recorded.  FHT:  FHR: 140 bpm, variability: moderate,  accelerations:  Present,  decelerations:  Absent UC:   regular, every 2-4 minutes SVE:   Dilation: Lip/rim Effacement (%): 100 Station: 0 Exam by:: Dr. [email or web address]  Labs: Lab Results  Component Value Date   WBC 10.7 (H) 06/14/2021   HGB 12.9 06/14/2021   HCT 37.1 06/14/2021   MCV 97.1 06/14/2021   PLT 152 06/14/2021    Assessment / Plan: Spontaneous labor, progressing normally  Labor: Progressing normally, now s/p AROM of forebag with improvement in fetal station and near-complete reduction of anterior lip. Continue to monitor for urge to push Fetal Wellbeing:  Category I Pain Control:  Epidural I/D:  n/a Anticipated MOD:  NSVD  06/16/2021, MD 06/15/2021, 7:49 AM

## 2021-06-15 NOTE — Anesthesia Preprocedure Evaluation (Signed)
Anesthesia Evaluation  Patient identified by MRN, date of birth, ID band Patient awake    Reviewed: Allergy & Precautions, H&P , NPO status , Patient's Chart, lab work & pertinent test results  History of Anesthesia Complications Negative for: history of anesthetic complications  Airway Mallampati: II  TM Distance: >3 FB     Dental   Pulmonary neg pulmonary ROS,    Pulmonary exam normal        Cardiovascular negative cardio ROS   Rhythm:regular Rate:Normal     Neuro/Psych  Headaches, Anxiety Depression negative psych ROS   GI/Hepatic negative GI ROS, Neg liver ROS,   Endo/Other  negative endocrine ROS  Renal/GU negative Renal ROS  negative genitourinary   Musculoskeletal   Abdominal   Peds  Hematology negative hematology ROS (+)   Anesthesia Other Findings   Reproductive/Obstetrics (+) Pregnancy (TOLAC)                             Anesthesia Physical Anesthesia Plan  ASA: 2  Anesthesia Plan: Epidural   Post-op Pain Management:    Induction:   PONV Risk Score and Plan:   Airway Management Planned:   Additional Equipment:   Intra-op Plan:   Post-operative Plan:   Informed Consent: I have reviewed the patients History and Physical, chart, labs and discussed the procedure including the risks, benefits and alternatives for the proposed anesthesia with the patient or authorized representative who has indicated his/her understanding and acceptance.       Plan Discussed with:   Anesthesia Plan Comments:         Anesthesia Quick Evaluation

## 2021-06-15 NOTE — Lactation Note (Signed)
This note was copied from a baby's chart. Lactation Consultation Note  Patient Name: Katherine Fletcher Date: 06/15/2021 Reason for consult: Follow-up assessment;Mother's request;Difficult latch;Breast reduction;Early term 37-38.6wks;Other (Comment) Age:36 hours  Mom assisted with latching using 20 NS and curve tip with DBM. LC observed a 10 min latch with signs of milk transfer and uterine contraction.  DBM consent completed and placed in chart.   Plan 1. To feed based on cues 8-12x 24hr period. Mom to offer breast and look for signs of milk transfer.  2. Mom to supplement with EBM first followed by [facility] with curve tip and syringe 5-7 ml per feeding. RN, Eugenie Filler aware to assist with supplementation and setting up DEBP.  3. DEBP q 3hrs for 15 min sized with 21 flange using hand pump, states comfortable fit.  4. I and O sheet reviewed.  All questions answered.   Maternal Data Has patient been taught Hand Expression?: Yes Does the patient have breastfeeding experience prior to this delivery?: Yes How long did the patient breastfeed?: 10 weeks infant painful latch, mom felt low milk supply affected first pregnancy  Feeding Mother's Current Feeding Choice: Breast Milk and Donor Milk  LATCH Score Latch: Repeated attempts needed to sustain latch, nipple held in mouth throughout feeding, stimulation needed to elicit sucking reflex.  Audible Swallowing: Spontaneous and intermittent  Type of Nipple: Flat (nipples are compressible and flat will erect some with nipple roll)  Comfort (Breast/Nipple): Soft / non-tender  Hold (Positioning): Assistance needed to correctly position infant at breast and maintain latch.  LATCH Score: 7   Lactation Tools Discussed/Used Tools: Pump;Flanges;Nipple Shields Nipple shield size: 20 Flange Size: 21 Breast pump type: Double-Electric Breast Pump Pump Education: Setup, frequency, and cleaning;Milk Storage Reason for Pumping: increase  stimulation Pumping frequency: every 3 hrs for 15 min  Interventions Interventions: Breast feeding basics reviewed;Assisted with latch;Skin to skin;Breast massage;Hand express;Pre-pump if needed;Breast compression;Adjust position;Support pillows;Position options;Expressed milk;DEBP;Education;LC Services brochure;Infant Driven Feeding Algorithm education  Discharge Pump: Personal  Consult Status Consult Status: Follow-up Date: 06/16/21 Follow-up type: In-patient    Katherine Quevedo  Fletcher 06/15/2021, 5:51 PM

## 2021-06-15 NOTE — Progress Notes (Signed)
Katherine Fletcher is a 36 y.o. G2P1001 at [redacted]w[redacted]d by LMP admitted for SOL/TOLAC.  Subjective: Breathing through CTXs. States that pain has significantly increased in severity. Patient visibly uncomfortable, still standing and swaying when I came to assess. Following cervical check, patient desires epidural.   Does have private information posted on the outside of her L&D room, RN inquired about personal information being posted in this way and patient and partner both stated that they wanted this information posted on the exterior of the door.    Objective: BP 127/71    Pulse 80    Temp 98.7 F (37.1 C) (Oral)    Ht 5\' 9"  (1.753 m)    Wt 103 kg    LMP 09/16/2020 (Exact Date)    SpO2 100%    BMI 33.52 kg/m  No intake/output data recorded. No intake/output data recorded.  FHT:  FHR: 140 bpm, variability: moderate,  accelerations:  Present,  decelerations:  Absent UC:   regular, every 2-5 minutes SVE:   Dilation: 6 Effacement (%): 90 Station: -1 Exam by:: Dr. [email or web address]  Labs: Lab Results  Component Value Date   WBC 10.7 (H) 06/14/2021   HGB 12.9 06/14/2021   HCT 37.1 06/14/2021   MCV 97.1 06/14/2021   PLT 152 06/14/2021    Assessment / Plan: Spontaneous labor, progressing normally  Labor: Progressing normally, will continue to monitor. Consider AROM if no change at next check Fetal Wellbeing:  Category I Pain Control:  Epidural desired at this time I/D:  n/a Anticipated MOD:  NSVD  06/16/2021, MD 06/15/2021, 1:42 AM

## 2021-06-15 NOTE — Progress Notes (Signed)
Labor Progress Note Katherine Fletcher is a 36 y.o. G2P1001 at [redacted]w[redacted]d presented for SOL/TOLAC.  S: Doing well. Feeling intermittent pressure. Comfortable with epidural. No concerns at this time.   O:  BP (!) 101/55    Pulse 69    Temp 98.7 F (37.1 C) (Oral)    Ht 5\' 9"  (1.753 m)    Wt 103 kg    LMP 09/16/2020 (Exact Date)    SpO2 99%    BMI 33.52 kg/m   EFM: Baseline 140 bpm, moderate variability, + accels, prolonged decel   CVE: Dilation: Lip/rim Effacement (%): 100 Station: 0 Presentation: Vertex Exam by:: Dr. [email or web address]  A&P: 36 y.o. G2P1001 [redacted]w[redacted]d   #Labor: Progressing well. Small anterior lip, 0 station. Amniotic sac noted over fetal head. Offered AROM and patient declined. Will continue expectant management and reassess in 1-2 hours, sooner if patient starts to feel pressure or urge to push.  #Pain: Epidural  #FWB: Cat 2 due to recent prolonged decel. Recovered well with position change. Reassuring variability and accels. Will continue to monitor.  #GBS negative  [redacted]w[redacted]d, MD 5:15 AM

## 2021-06-15 NOTE — Discharge Summary (Signed)
Postpartum Discharge Summary     Patient Name: Katherine Fletcher DOB: [birth year] MRN: [id number]  Date of admission: 06/14/2021 Delivery date:06/15/2021  Delivering provider: Olga Coaster  Date of discharge: 06/17/2021  Admitting diagnosis: Indication for care in labor or delivery [O75.9] Intrauterine pregnancy: [redacted]w[redacted]d    Secondary diagnosis:  Principal Problem:   VBAC (vaginal birth after Cesarean) Active Problems:   History of cesarean section   Encounter for supervision of normal pregnancy, antepartum   Depression/anxiety/PTSD/Bipolar/BPD  Additional problems: None    Discharge diagnosis: Term Pregnancy Delivered and VBAC                                              Post partum procedures: None Augmentation: AROM Complications: None  Hospital course: Onset of Labor With Vaginal Delivery      36y.o. yo G2P1001 at 334w6das admitted in Active Labor on 06/14/2021. Patient had an uncomplicated labor course as follows:  Membrane Rupture Time/Date: 3:46 AM ,06/15/2021   Delivery Method:VBAC, Spontaneous  Episiotomy: None  Lacerations:  2nd degree;Vaginal  Patient had an uncomplicated postpartum course.  She is ambulating, tolerating a regular diet, passing flatus, and urinating well. Patient is discharged home in stable condition on 06/17/21.  Newborn Data: Birth date:06/15/2021  Birth time:10:39 AM  Gender:Female  Living status:Living  Apgars:8 ,9  Weight:3520 g   Magnesium Sulfate received: No BMZ received: No Rhophylac: N/A MMR: N/A T-DaP: Given prenatally Flu: Yes - prenatally Transfusion: No  Physical exam  Vitals:   06/15/21 2220 06/16/21 0220 06/16/21 1417 06/16/21 2000  BP: 99/67 98/65 99/66 100/62  Pulse: 78 76 73 74  Resp: _0 Temp: 98.6 F (37 C) 98.5 F (36.9 C) 98.2 F (36.8 C)   TempSrc: Oral Oral Oral   SpO2: 100% 99% 100%   Weight:      Height:       General: alert, cooperative, and no distress Lochia: appropriate Uterine Fundus: firm  and below umbilicus  DVT Evaluation: no LE edema or calf tenderness to palpation   Labs: Lab Results  Component Value Date   WBC 10.6 (H) 06/16/2021   HGB 10.7 (L) 06/16/2021   HCT 30.5 (L) 06/16/2021   MCV 99.0 06/16/2021   PLT 129 (L) 06/16/2021   CMP Latest Ref Rng & Units 04/13/2021  Glucose 70 - 99 mg/dL 95  BUN 6 - 20 mg/dL 5(L)  Creatinine 0.44 - 1.00 mg/dL 0.49  Sodium 135 - 145 mmol/L 130(L)  Potassium 3.5 - 5.1 mmol/L 3.7  Chloride 98 - 111 mmol/L 101  CO2 22 - 32 mmol/L 21(L)  Calcium 8.9 - 10.3 mg/dL 8.1(L)  Total Protein 6.5 - 8.1 g/dL 6.6  Total Bilirubin 0.3 - 1.2 mg/dL 0.7  Alkaline Phos 38 - 126 U/L 75  AST 15 - 41 U/L 17  ALT 0 - 44 U/L 15   Edinburgh Score: Edinburgh Postnatal Depression Scale Screening Tool 06/15/2021  I have been able to laugh and see the funny side of things. 0  I have looked forward with enjoyment to things. 1  I have blamed myself unnecessarily when things went wrong. 1  I have been anxious or worried for no good reason. 1  I have felt scared or panicky for no good reason. 0  Things have been getting on top of me.  0  I have been so unhappy that I have had difficulty sleeping. 0  I have felt sad or miserable. 0  I have been so unhappy that I have been crying. 1  The thought of harming myself has occurred to me. 0  Edinburgh Postnatal Depression Scale Total 4     After visit meds:  Allergies as of 06/17/2021       Reactions   Cat Hair Extract Anaphylaxis   Anxiety, rash, swelling, itching, nausea, shortness of breath   Other Hives, Shortness Of Breath, Itching, Nausea Only, Swelling, Anxiety, Rash, Other (See Comments)   Opiates-GI Upset, constipation, itching   Latex Rash        Medication List     TAKE these medications    acetaminophen 500 MG tablet Commonly known as: TYLENOL Take 2 tablets (1,000 mg total) by mouth every 8 (eight) hours as needed (pain).   B-12 [phone] MCG Subl   cholecalciferol 25 MCG (1000  UNIT) tablet Commonly known as: VITAMIN D3   ibuprofen 600 MG tablet Commonly known as: ADVIL Take 1 tablet (600 mg total) by mouth every 6 (six) hours as needed (pain).   PRENATAL GUMMIES PO Take by mouth.   SLOW IRON PO Take by mouth.         Discharge home in stable condition Infant Feeding: Breast Infant Disposition: home with mother Discharge instruction: per After Visit Summary and Postpartum booklet. Activity: Advance as tolerated. Pelvic rest for 6 weeks.  Diet: routine diet  Follow up Visit:  Follow-up Information     [facility]. Schedule an appointment as soon as possible for a visit in 4 week(s).   Specialty: Obstetrics and Gynecology Why: postpartum visit Contact information: [address] [place] [phone]               Please schedule this patient for a Virtual postpartum visit in 4 weeks with the following provider: Any provider. Additional Postpartum F/U: Postpartum Depression checkup  High risk pregnancy complicated by:  Previous CS Delivery mode:  VBAC, Spontaneous  Anticipated Birth Control:  Condoms > partner vasectomy  06/17/2021 Genia Del, MD

## 2021-06-15 NOTE — Lactation Note (Signed)
This note was copied from a baby's chart. Lactation Consultation Note  Patient Name: Katherine Fletcher [id number] Date: 06/15/2021 Reason for consult: L&D Initial assessment;Breast reduction;Breastfeeding assistance;Other (Comment) (2nd LD LC visit ( was called by the nurse, mom's repair completed. Baby STS and nuzzling left breast, but not latched. LC offered to assist/ mom receptive. Baby on and off , then STS.) Age:36 hours Due to family besides dad being in the room LC was unable to ask questions about her milk supply with her 1st baby and details about her breast reduction.  Will need to be asked at the F/U LD LC visit for [facility] plan.   Maternal Data - Breast reduction.  Per mom attempted to feed her now 59 month old.     Feeding Mother's Current Feeding Choice: Breast Milk  LATCH Score Latch: Repeated attempts needed to sustain latch, nipple held in mouth throughout feeding, stimulation needed to elicit sucking reflex.  Audible Swallowing: None  Type of Nipple: Flat (areola semi - compressible)  Comfort (Breast/Nipple): Soft / non-tender  Hold (Positioning): Assistance needed to correctly position infant at breast and maintain latch.  LATCH Score: 5   Lactation Tools Discussed/Used    Interventions Interventions: Breast feeding basics reviewed;Assisted with latch;Skin to skin;Reverse pressure;Breast compression;Support pillows;Education  Discharge    Consult Status Consult Status: Follow-up from L&D Date: 06/15/21 Follow-up type: In-patient    Matilde Sprang Tor Tsuda 06/15/2021, 12:15 PM

## 2021-06-15 NOTE — Progress Notes (Signed)
Katherine Fletcher is a 36 y.o. G2P1001 at [redacted]w[redacted]d by LMP admitted for SOL/TOLAC.  Subjective: Katherine Fletcher is doing well, desire to rest for now. She has no questions or concerns at this time.    Objective: BP 106/62    Pulse 77    Temp 98.7 F (37.1 C) (Oral)    Ht 5\' 9"  (1.753 m)    Wt 103 kg    LMP 09/16/2020 (Exact Date)    SpO2 99%    BMI 33.52 kg/m  I/O last 3 completed shifts: In: -  Out: 625 [Urine:625] No intake/output data recorded. -She is accompanied by her partner: 09/18/2020 and support person: Mary  FHT:  FHR: 140's bpm, variability: moderate,  accelerations:  Present,  decelerations:  Present early UC:   regular, every 2-4.5 minutes SVE:   Dilation: 10 Effacement (%): 100 Station: 0 Exam by:: A. Williams  Labs: Lab Results  Component Value Date   WBC 10.7 (H) 06/14/2021   HGB 12.9 06/14/2021   HCT 37.1 06/14/2021   MCV 97.1 06/14/2021   PLT 152 06/14/2021    Assessment / Plan: Spontaneous labor, progressing normally w/o augmentation.  Labor:  Passive second stage labor -no urge to push at this time. Coping well and no pain.  Will continue to assess.  Fetal Wellbeing:  Category I Pain Control:  Epidural I/D:  n/a Anticipated MOD:  NSVD   06/16/2021, MD 06/15/2021, 8:50 AM

## 2021-06-15 NOTE — Anesthesia Procedure Notes (Signed)
Epidural Patient location during procedure: OB Start time: 06/15/2021 2:01 AM End time: 06/15/2021 2:12 AM  Staffing Anesthesiologist: Lidia Collum, MD Performed: anesthesiologist   Preanesthetic Checklist Completed: patient identified, IV checked, risks and benefits discussed, monitors and equipment checked, pre-op evaluation and timeout performed  Epidural Patient position: sitting Prep: DuraPrep Patient monitoring: heart rate, continuous pulse ox and blood pressure Approach: midline Location: L3-L4 Injection technique: LOR air  Needle:  Needle type: Tuohy  Needle gauge: 17 G Needle length: 9 cm Needle insertion depth: 9 cm Catheter type: closed end flexible Catheter size: 19 Gauge Catheter at skin depth: 14 cm Test dose: negative  Assessment Events: blood not aspirated, injection not painful, no injection resistance, no paresthesia and negative IV test  Additional Notes Reason for block:procedure for pain

## 2021-06-15 NOTE — Lactation Note (Signed)
This note was copied from a baby's chart. Lactation Consultation Note  Patient Name: Katherine Fletcher [id number] Date: 06/15/2021 Reason for consult: L&D Initial assessment;Other (Comment) (LC went to visit mom in LD at 50 mins PP and mom still being repaired . LD RN aware to page for [facility] when mom is ready.) Age: 36 mins  Maternal Data    Feeding Mother's Current Feeding Choice: Breast Milk  LATCH Score                    Lactation Tools Discussed/Used    Interventions    Discharge    Consult Status Consult Status: Follow-up from L&D Date: 06/15/21 Follow-up type: In-patient    [place] 06/15/2021, 11:32 AM

## 2021-06-16 ENCOUNTER — Encounter: Payer: Medicaid Other | Admitting: Obstetrics & Gynecology

## 2021-06-16 LAB — CBC
HCT: 30.5 % — ABNORMAL LOW (ref 36.0–46.0)
Hemoglobin: 10.7 g/dL — ABNORMAL LOW (ref 12.0–15.0)
MCH: 34.7 pg — ABNORMAL HIGH (ref 26.0–34.0)
MCHC: 35.1 g/dL (ref 30.0–36.0)
MCV: 99 fL (ref 80.0–100.0)
Platelets: 129 10*3/uL — ABNORMAL LOW (ref 150–400)
RBC: 3.08 MIL/uL — ABNORMAL LOW (ref 3.87–5.11)
RDW: 14.1 % (ref 11.5–15.5)
WBC: 10.6 10*3/uL — ABNORMAL HIGH (ref 4.0–10.5)
nRBC: 0 % (ref 0.0–0.2)

## 2021-06-16 NOTE — Lactation Note (Signed)
This note was copied from a baby's chart. Lactation Consultation Note  Patient Name: Katherine Fletcher Date: 06/16/2021 Reason for consult: Follow-up assessment;Difficult latch;Early term 37-38.6wks;Nipple pain/trauma;Breast reduction Age:36 hours  LC in to visit with P2 Mom of ET infant.  Baby at 1.7% weight loss. Mom had a breast reduction surgery in 2017.  She has a 7 month old baby at home that she breastfed for 10 weeks and supplemented due to low milk supply.    Mom has bilateral blistering and crack on right breast.  Bruising noted on left areola.  Mom using donor milk with curved tip syringe into shield.  Assisted with football hold on right breast.  While sandwiching up breast, baby opened widely and latched deeply.  He was able to sustain a deep latch to the breast on and off for 10 mins.  Nipple pinched when he came off.    Talked about importance of increasing volume of supplementation now that baby is in his second day life.    Initiated a 5 fr feeding tube and syringe in 20 mm nipple shield.  Nipple shield pulled nipple into shield well.  Baby fed for another 10 min on left breast with 10 ml of donor milk.  Baby fell asleep soundly after feeding.  Mom felt uterine cramping during the feeding.  Mom encouraged to double pump to support her milk supply.  Mom feeling pain with pumping.  Using 21 mm flanges and encouraged Mom to use low suction.   Mom encouraged to call for help prn  LATCH Score Latch: Grasps breast easily, tongue down, lips flanged, rhythmical sucking.  Audible Swallowing: Spontaneous and intermittent (with supplementation at the breast)  Type of Nipple: Everted at rest and after stimulation (left nipple short)  Comfort (Breast/Nipple): Filling, red/small blisters or bruises, mild/mod discomfort  Hold (Positioning): Assistance needed to correctly position infant at breast and maintain latch.  LATCH Score: 8   Lactation Tools Discussed/Used Tools:  Pump;Flanges;Comfort gels;Nipple Shields;20F feeding tube / Syringe Nipple shield size: 20 Flange Size: 21 Breast pump type: Double-Electric Breast Pump;Manual Pump Education: Setup, frequency, and cleaning;Milk Storage Reason for Pumping: support milk supply/history of breast reduction surgery Pumping frequency: Encouraged Mom to double pump after breast feeding Pumped volume: 0 mL (drops)  Interventions Interventions: Breast feeding basics reviewed;Assisted with latch;Skin to skin;Breast massage;Hand express;Pre-pump if needed;Breast compression;Adjust position;Support pillows;Position options;Expressed milk;Hand pump;DEBP;Comfort gels  Discharge Pump: Personal Scientist, clinical (histocompatibility and immunogenetics))  Consult Status Consult Status: Follow-up Date: 06/17/21 Follow-up type: In-patient    [name] 06/16/2021, 5:20 PM

## 2021-06-16 NOTE — Progress Notes (Signed)
Post Partum Day 1 Subjective: Patient is doing well. Very tired but no other complaints. Pain well-controlled with Ibuprofen. Had left thigh numbness, which she attributed to the epidural, but this improved after walking. Up ad lib, voiding, and tolerating PO, and trying to eat whenever she nurses. Baby was not latching well yesterday but improving this morning.  Objective: Blood pressure 98/65, pulse 76, temperature 98.5 F (36.9 C), temperature source Oral, resp. rate 16, height 5\' 9"  (1.753 m), weight 103 kg, last menstrual period 09/16/2020, SpO2 99 %, unknown if currently breastfeeding.  Physical Exam:  General: alert, cooperative, fatigued, and no distress Lochia: appropriate DVT Evaluation: No evidence of DVT seen on physical exam. No cords or calf tenderness. No significant calf/ankle edema.  Recent Labs    06/14/21 2325 06/16/21 0442  HGB 12.9 10.7*  HCT 37.1 30.5*    Assessment/Plan: Plan for discharge tomorrow and Lactation consult  Partner will use condoms or get a vasectomy Declined circumcision   LOS: 2 days   Katherine Fletcher 06/16/2021, 6:43 AM

## 2021-06-16 NOTE — Clinical Social Work Maternal (Signed)
CLINICAL SOCIAL WORK MATERNAL/CHILD NOTE  Patient Details  Name: Katherine Fletcher MRN: [id number] Date of Birth: January 04, 1986  Date:  06/16/2021  Clinical Social Worker Initiating Note:  Darra Lis, [place] Date/Time: Initiated:  06/16/21/0945     Child's Name:  Deno Etienne   Biological Parents:  Mother, Father Chanetta Marshall)   Need for Interpreter:  None   Reason for Referral:  Behavioral Health Concerns   Address:  [address] [place] [address]    Phone number:  [phone] (home)     Additional phone number:   Household Members/Support Persons (HM/SP):   Household Member/Support Person 1, Household Member/Support Person 2   HM/SP Name Relationship DOB or Age  HM/SP -1 Chanetta Marshall Significant Other    HM/SP -2 Eligah East Son 10/31/2019  HM/SP -3        HM/SP -4        HM/SP -5        HM/SP -6        HM/SP -7        HM/SP -8          Natural Supports (not living in the home):  Friends   Professional Supports: Transport planner ([place])   Employment: Unemployed   Type of Work:     Education:  Public librarian arranged:    Museum/gallery curator Resources:  Medicaid   Other Resources:  [facility]   Cultural/Religious Considerations Which May Impact Care:    Strengths:  Ability to meet basic needs  , Home prepared for child  , Pediatrician chosen   Psychotropic Medications:         Pediatrician:    [employer]  Pediatrician List:   [place]      Pediatrician Fax Number:    Risk Factors/Current Problems:  None   Cognitive State:  Insightful  , Alert  , Linear Thinking     Mood/Affect:  Interested  , Bright     CSW Assessment: CSW consulted for history of depression, anxiety, borderline and bipolar. CSW met with MOB to assess and provide support. CSW observed infant sleeping in bassinet. FOB entered the room in  the middle of assessment. MOB provided permission for him to stay for remainder of assessment. CSW informed MOB of the reason for consult and assessed mood. MOB was pleasant as she reported she is currently tired. MOB stated she had a really easy pregnancy, although her mood was up and down. MOB confirmed the previous stated diagnosis, reporting she was first diagnosed at age 36. MOB expressed she believes some off the mood changes was a trauma response from the c-section she experienced with her first birth. MOB stated she coped by getting into therapy at [place]. MOB shared therapy made her feel "insanely better." MOB attends therapy weekly via tele-health. MOB stated she has been prescribed medications in the past but they do not work. CSW inquired on additional coping mechanisms. MOB shared she tries to eat properly, exercise and get out of the house. CSW commended MOB on being proactive and identifying ways to cope. CSW assessed MOB current mood. MOB reported she is feeling bonded with infant and really happy. MOB identified a supports system consisting of FOB and friends. MOB denies any current SI or HI. CSW did not inquire on DV due to FOB being present.  CSW provided education regarding the baby blues period versus PPD. CSW provided the New Mom Checklist and encouraged MOB to self evaluate and contact a medical professional if symptoms are noted at any time.  CSW provided review of Sudden Infant Death Syndrome (SIDS) precautions. MOB has all infant essentials. MOB denies any barriers to follow-up care. MOB reported she has no additional needs at this time.   CSW identifies no further need for intervention and no barriers to discharge at this time.  CSW Plan/Description:  Perinatal Mood and Anxiety Disorder (PMADs) Education, Sudden Infant Death Syndrome (SIDS) Education, No Further Intervention Required/No Barriers to Discharge, Other Patient/Family Education, Other Information/Referral to  [employer], [place] 06/16/2021, 10:23 AM

## 2021-06-17 MED ORDER — IBUPROFEN 600 MG PO TABS: 600.0000 mg | ORAL_TABLET | Freq: Four times a day (QID) | ORAL | 0 refills | Status: DC | PRN

## 2021-06-17 MED ORDER — ACETAMINOPHEN 500 MG PO TABS: 1000.0000 mg | ORAL_TABLET | Freq: Three times a day (TID) | ORAL | 0 refills | Status: DC | PRN

## 2021-06-17 NOTE — Lactation Note (Signed)
This note was copied from a baby's chart. Lactation Consultation Note  Patient Name: Katherine Fletcher WOEHO'Z Date: 06/17/2021 Reason for consult: Follow-up assessment;Early term 37-38.6wks;Infant weight loss;Other (Comment);Breast reduction (4 % weight loss/ see LC note for details from the [facility] consult) Age:36 hours Per mom feeling exhausted, was able to sleep 2 hours during the night.  Per mom the NS and the feeding tube is to overwhelming.  During the night baby latched with the NS, came off and latched without it.  Per mom ended up finger  feeding with the tube.  LC discussed coming up with a LC plan that will be less stressful  and will meet her Breast feeding needs to bring her milk and the baby's nutritional needs.  Per mom the most she has ever pumped off is 2 oz and with her 1st baby gave it 10 weeks. LC praised her for the 10 weeks and the volume she was able to pump off.   LC plan: Feed with feeding cues and by 3 hours ( 8-12 times in 24 hours - STS.  If the baby is to sluggish to start offer and appetizer via instilling EBM or formula in the top or a bottle  then latch.  Feed for 15 - 20 mins / supplement pace feeding at least 30 ml and post pump both breast for 15 -20 mins / save milk for the next feeding.  LC stressed the importance of feeding the baby by 3 hours and calories and consistent pumping to enhance the milk coming in.  [facility] also stressed with a Breast reduction " it is a wait and see- focus on what you can give your baby not what you can't" .   LC reviewed BF D/C teaching.  Mom aware the [facility] O/P clinic will call her for [facility] appt.  LC encouraged mom to work on some naps so help the exhaustion.   Maternal Data    Feeding Mother's Current Feeding Choice: Breast Milk and Donor Milk  LATCH Score                    Lactation Tools Discussed/Used Tools: Pump;Nipple Shields Nipple shield size: 20 Flange Size: 21 Breast pump type: Manual;Double-Electric  Breast Pump Pump Education: Milk Storage Pumped volume: 0 mL  Interventions Interventions: Breast feeding basics reviewed;Education;DEBP;Pace feeding;LC Services brochure  Discharge Discharge Education: Engorgement and breast care;Warning signs for feeding baby;Outpatient recommendation;Outpatient Epic message sent;Other (comment) (mom receptive to F/U LC , and mom aware she will receive a call next week to set up the DEBP.) Pump: Personal;DEBP;Manual  Consult Status Consult Status: Complete Date: 06/17/21    Kathrin Greathouse 06/17/2021, 12:11 PM

## 2021-06-20 ENCOUNTER — Telehealth: Payer: Self-pay

## 2021-06-20 NOTE — Telephone Encounter (Signed)
Transition Care Management Unsuccessful Follow-up Telephone Call ° °Date of discharge and from where:  06/17/2021-[facility]  ° °Attempts:  1st Attempt ° °Reason for unsuccessful TCM follow-up call:  Left voice message ° °  °

## 2021-06-21 NOTE — Telephone Encounter (Signed)
Transition Care Management Unsuccessful Follow-up Telephone Call ° °Date of discharge and from where:  06/17/2021-[facility]  ° °Attempts:  2nd Attempt ° °Reason for unsuccessful TCM follow-up call:  Left voice message ° °  °

## 2021-06-22 NOTE — Telephone Encounter (Signed)
Transition Care Management Unsuccessful Follow-up Telephone Call ° °Date of discharge and from where:  06/17/2021-[facility] ° °Attempts:  3rd Attempt ° °Reason for unsuccessful TCM follow-up call:  Left voice message ° °  °

## 2021-06-23 ENCOUNTER — Inpatient Hospital Stay (HOSPITAL_COMMUNITY): Admit: 2021-06-23 | Payer: Self-pay

## 2021-06-28 ENCOUNTER — Telehealth (HOSPITAL_COMMUNITY): Payer: Self-pay

## 2021-06-28 NOTE — Telephone Encounter (Signed)
"  Doing pretty good. Working on breastfeeding. My healing is going pretty well. I'm alittle sore but I havent needed pain medication." RN reviewed LC resources with patient and will email these to patient. Patient declines any other questions or concerns about her healing.  "He has some enlarged breast buds. Is that normal?" RN explained to patient that enlarged breast buds is from mothers horomones still coming through baby and that it is normal. "He gained weight at his pediatrician appointment last week. I have been feeding him on cue. He breastfeeds, gets formula, and I'm pumping as well. I've noticed that sometimes he seems constipated." RN explained to patient that sometimes formula can be constipating for baby and to reach out to her pediatrician if she has concerns about constipation. "He sleeps in a bassinet in my room." RN reviewed ABC's of safe sleep with patient. Patient declines any questions or concerns about baby.  EPDS score is 6.  Marcelino Duster [facility] 06/28/2021,1506

## 2021-06-29 ENCOUNTER — Encounter: Payer: Self-pay | Admitting: Obstetrics & Gynecology

## 2021-06-29 ENCOUNTER — Ambulatory Visit (INDEPENDENT_AMBULATORY_CARE_PROVIDER_SITE_OTHER): Payer: Medicaid Other | Admitting: Obstetrics & Gynecology

## 2021-06-29 ENCOUNTER — Other Ambulatory Visit: Payer: Self-pay

## 2021-06-29 DIAGNOSIS — F39 Unspecified mood [affective] disorder: Secondary | ICD-10-CM

## 2021-06-29 NOTE — Progress Notes (Signed)
° ° °  Postpartum Visit Note  Katherine Fletcher is a 36 y.o. G71P2002 female who presents for a postpartum visit. She is 2-3 week postop following a VBAC on 06/15/21.  Presents for wellness check in.  Today she notes that she is doing well.  Doing better this week.  First week felt overwhelmed both positive and negative.  Denies SI/HI.  Meeting with her therapist regularly.  Notes having a great experience at this hospital and feeling much better about the entire experience.   Edinburgh Postnatal Depression Scale - 06/29/21 1124       Edinburgh Postnatal Depression Scale:  In the Past 7 Days   I have been able to laugh and see the funny side of things. 0    I have looked forward with enjoyment to things. 1    I have blamed myself unnecessarily when things went wrong. 3    I have been anxious or worried for no good reason. 1    I have felt scared or panicky for no good reason. 0    Things have been getting on top of me. 0    I have been so unhappy that I have had difficulty sleeping. 0    I have felt sad or miserable. 1    I have been so unhappy that I have been crying. 1    The thought of harming myself has occurred to me. 0    Edinburgh Postnatal Depression Scale Total 7             Review of Systems Pertinent items are noted in HPI.    Objective:  BP 102/65 (BP Location: Right Arm, Patient Position: Sitting, Cuff Size: Normal)    Pulse 74    Ht 5\' 8"  (1.727 m)    Wt 216 lb (98 kg)    Breastfeeding Yes    BMI 32.84 kg/m    Physical Examination:  GENERAL ASSESSMENT: well developed and well nourished SKIN: normal color, no lesions CHEST: normal air exchange, respiratory effort normal with no retractions HEART: regular rate and rhythm EXTREMITY: No edema PSYCH: mood appropriate, normal affect       Assessment:    Postpartum visit   Plan:   Meeting milestones appropriately F/U as scheduled in ~ 4wks  , DO Attending Obstetrician & Gynecologist, Faculty  [facility], [facility]

## 2021-07-12 DIAGNOSIS — F603 Borderline personality disorder: Secondary | ICD-10-CM | POA: Diagnosis not present

## 2021-07-12 DIAGNOSIS — F4310 Post-traumatic stress disorder, unspecified: Secondary | ICD-10-CM | POA: Diagnosis not present

## 2021-07-19 DIAGNOSIS — F603 Borderline personality disorder: Secondary | ICD-10-CM | POA: Diagnosis not present

## 2021-07-19 DIAGNOSIS — F4310 Post-traumatic stress disorder, unspecified: Secondary | ICD-10-CM | POA: Diagnosis not present

## 2021-07-20 ENCOUNTER — Encounter: Payer: Self-pay | Admitting: Women's Health

## 2021-07-20 ENCOUNTER — Ambulatory Visit (INDEPENDENT_AMBULATORY_CARE_PROVIDER_SITE_OTHER): Payer: Medicaid Other | Admitting: Women's Health

## 2021-07-20 ENCOUNTER — Other Ambulatory Visit: Payer: Self-pay

## 2021-07-20 DIAGNOSIS — Z98891 History of uterine scar from previous surgery: Secondary | ICD-10-CM

## 2021-07-20 DIAGNOSIS — Z30018 Encounter for initial prescription of other contraceptives: Secondary | ICD-10-CM | POA: Diagnosis not present

## 2021-07-20 MED ORDER — PHEXXI 1.8-1-0.4 % VA GEL: 1.0000 | Freq: Once | VAGINAL | 11 refills | Status: AC

## 2021-07-20 NOTE — Progress Notes (Signed)
? ?POSTPARTUM VISIT ?Patient name: Katherine Fletcher MRN [id number]  Date of birth: Jul 10, 1985 ?Chief Complaint:   ?Postpartum Care (Discuss birth control options) ? ?History of Present Illness:   ?Katherine Fletcher is a 36 y.o. G93P2002 Caucasian female being seen today for a postpartum visit. She is 5 weeks postpartum following a vaginal birth after cesarean (VBAC) at 38.6 gestational weeks. IOL: no, for n/a. Anesthesia: epidural.  Laceration: 2nd degree.  Complications: none. Inpatient contraception: no.   ?Pregnancy uncomplicated. ?Tobacco use: former . Substance use disorder: no. ?Last pap smear: 11/21/19 and results were negative per pt report at [facility] . Next pap smear due: 2024 ?Patient's last menstrual period was 07/06/2021 (approximate). ? ?Postpartum course has been uncomplicated. Bleeding none. Bowel function is  normal bm's, has h/o IBS, has some pain . Bladder function is normal. Urinary incontinence? no, fecal incontinence? no ?Patient is not sexually active. Last sexual activity: prior to birth of baby. Desired contraception:  possible vasectomy for partner, condoms until that. Discussed Phexxi, would like to try it as well . Patient does not know want a pregnancy in the future.  Desired family size is uncertain #of children.  ? ?The pregnancy intention screening data noted above was reviewed. Potential methods of contraception were discussed. The patient elected to proceed with No data recorded. ? ?Edinburgh Postpartum Depression Screening: negative, has h/o dep/anx/PTSD/bipolar/BPD, not currently on meds. Feels she is pretty much at baseline, does feel lack of sleep has been an issue. Denies SI/HI/II. Has been on multiple meds in past, scared to restart d/t side effects. Sees therapist at [facility] therapy. Is interested in having psychiatrist in case decides for meds or other options.  ? Edinburgh Postnatal Depression Scale - 07/20/21 1130   ? ?  ? Edinburgh Postnatal Depression Scale:  In the Past 7 Days  ? I have  been able to laugh and see the funny side of things. 1   ? I have looked forward with enjoyment to things. 1   ? I have blamed myself unnecessarily when things went wrong. 3   ? I have been anxious or worried for no good reason. 0   ? I have felt scared or panicky for no good reason. 0   ? Things have been getting on top of me. 0   ? I have been so unhappy that I have had difficulty sleeping. 0   ? I have felt sad or miserable. 2   ? I have been so unhappy that I have been crying. 1   ? The thought of harming myself has occurred to me. 0   ? Edinburgh Postnatal Depression Scale Total 8   ? ?  ?  ? ?  ?  ?GAD 7 : Generalized Anxiety Score 03/28/2021 12/09/2020 10/25/2020  ?Nervous, Anxious, on Edge '1 1 2  ' ?Control/stop worrying '1 1 2  ' ?Worry too much - different things 0 1 2  ?Trouble relaxing '2 2 2  ' ?Restless 2 0 1  ?Easily annoyed or irritable '1 3 3  ' ?Afraid - awful might happen 0 0 0  ?Total GAD 7 Score '7 8 12  ' ? ? ? ?Baby's course has been uncomplicated. Baby is feeding by bottle. Infant has a pediatrician/family doctor? Yes.  Childcare strategy if returning to work/school:  right now is staying home w/ children .  Pt has material needs met for her and baby: Yes.   ?Review of Systems:   ?Pertinent items are noted in HPI ?Denies Abnormal vaginal discharge  w/ itching/odor/irritation, headaches, visual changes, shortness of breath, chest pain, abdominal pain, severe nausea/vomiting, or problems with urination or bowel movements. ?Pertinent History Reviewed:  ?Reviewed past medical,surgical, obstetrical and family history.  ?Reviewed problem list, medications and allergies. ?OB History  ?Gravida Para Term Preterm AB Living  ?'2 2 2     2  ' ?SAB IAB Ectopic Multiple Live Births  ?      0 2  ?  ?# Outcome Date GA Lbr Len/2nd Weight Sex Delivery Anes PTL Lv  ?2 Term 06/15/21 [id number]:43 / 02:56 7 lb 12.2 oz (3.52 kg) M VBAC EPI, Local  LIV  ?1 Term 10/27/19 [id number] lb 12 oz (3.062 kg) M CS-LTranv EPI N LIV  ?    Complications: Fetal Intolerance  ? ?Physical Assessment:  ? ?Vitals:  ? 07/20/21 1124  ?BP: 102/71  ?Pulse: 77  ?Weight: 219 lb (99.3 kg)  ?Height: '5\' 9"'  (1.753 m)  ?Body mass index is 32.34 kg/m?. ? ?     Physical Examination:  ? General appearance: alert, well appearing, and in no distress ? Mental status: alert, oriented to person, place, and time ? Skin: warm & dry  ? Cardiovascular: normal heart rate noted  ? Respiratory: normal respiratory effort, no distress  ? Breasts: deferred, no complaints  ? Abdomen: soft, non-tender  ? Pelvic: lac healing well. Thin prep pap obtained: No ? Rectal: not examined ? Extremities: Edema: none  ? ?Chaperone: JaLevy Pupa ?      ?No results found for this or any previous visit (from the past 24 hour(s)).  ?Assessment & Plan:  ?1) Postpartum exam ?2) 5 wks s/p vaginal birth after cesarean (VBAC) ?3) bottle feeding ?4) Depression screening ?5) Contraception plans condoms, rx Phexxi ?6) Dep/anx/PTSD/bipolar/BPD> not currently on meds, [facility] ? ?Essential components of care per ACOG recommendations: ? ?1.  Mood and well being:  ?If positive depression screen, discussed and plan developed.  ?If using tobacco we discussed reduction/cessation and risk of relapse ?If current substance abuse, we discussed and referral to local resources was offered.  ? ?2. Infant care and feeding:  ?If breastfeeding, discussed returning to work, pumping, breastfeeding-associated pain, guidance regarding return to fertility while lactating if not using another method. If needed, patient was provided with a letter to be allowed to pump q 2-3hrs to support lactation in a private location with access to a refrigerator to store breastmilk.   ?Recommended that all caregivers be immunized for flu, pertussis and other preventable communicable diseases ?If pt does not have material needs met for her/baby, referred to local resources for help obtaining  these. ? ?3. Sexuality, contraception and birth spacing ?Provided guidance regarding sexuality, management of dyspareunia, and resumption of intercourse ?Discussed avoiding interpregnancy interval <79m73m and recommended birth spacing of 18 months ? ?4. Sleep and fatigue ?Discussed coping options for fatigue and sleep disruption ?Encouraged family/partner/community support of 4 hrs of uninterrupted sleep to help with mood and fatigue ? ?5. Physical recovery  ?If pt had a C/S, assessed incisional pain and providing guidance on normal vs prolonged recovery ?If pt had a laceration, perineal healing and pain reviewed.  ?If urinary or fecal incontinence, discussed management and referred to PT or uro/gyn if indicated  ?Patient is safe to resume physical activity. Discussed attainment of healthy weight. ? ?6.  Chronic disease management ?Discussed pregnancy complications if any, and their implications for future childbearing and long-term  maternal health. ?Review recommendations for prevention of recurrent pregnancy complications, such as 17 hydroxyprogesterone caproate to reduce risk for recurrent PTB not applicable, or aspirin to reduce risk of preeclampsia not applicable. ?Pt had GDM: no. If yes, 2hr GTT scheduled: not applicable. ?Reviewed medications and non-pregnant dosing including consideration of whether pt is breastfeeding using a reliable resource such as LactMed: not applicable ?Referred for f/u w/ PCP or subspecialist providers as indicated: not applicable ? ?7. Health maintenance ?Mammogram at 36yo or earlier if indicated ?Pap smears as indicated ? ?Meds:  ?Meds ordered this encounter  ?Medications  ? Lactic Ac-Citric Ac-Pot Bitart (PHEXXI) 1.8-1-0.4 % GEL  ?  Sig: Place 1 Applicatorful vaginally once for 1 dose. Up to 1 hour before sex  ?  Dispense:  180 g  ?  Refill:  11  ?  Order Specific Question:   Supervising Provider  ?  Answer:   Tania Ade H [[id number]]  ? ? ?Follow-up: Return in about 1 year (around  07/21/2022) for Physical.  ? ?No orders of the defined types were placed in this encounter. ? ? ?[place], WHNP-BC ?07/20/2021 ?12:15 PM ?  ?

## 2021-07-20 NOTE — Patient Instructions (Signed)
[facility]  ?[address] ?[place], [place] [address] ?Phone: [phone] ?Hours: Mon-Fri 8AM-5PM ? ?If you're a walk-in patient there is generally a wait time involved on a "first come, first served basis" otherwise contact us beforehand to setup an appointment. If you're in crisis please use our 24-Hour Crisis Hotline for immediate access to a clinician. 24 Hour Crisis Line: [phone]  ? ?If this is your first time please bring the following with you: ?Insurance/Medicaid Card ?ID or Social Security Card ?Any referral documentation ? ?Payment Options ?Individuals with Medicaid have no obligation for a copay. ?Individuals with Medicare or private insurance will be obligated to meet their policy's requirement(s). ?Individuals who are uninsured will be eligible for a sliding or discounted scaled as defined by the relevant Managed Care Organization/Local Management Entity ? ?Services:  ?Substance Abuse Outpatient Treatment ?Mental Health Outpatient Treatment ?Psychiatric Services/ Medical Services ?Advanced Access/Walk-In Clinic ?Mobile Crisis Management Services ?[employer] developer) ?Dialectic Behavioral Therapy ?Critical Time Intervention ?Psychosocial Rehabilitation [facility]) ?Medication Assisted Treatment, ?

## 2021-07-26 DIAGNOSIS — F603 Borderline personality disorder: Secondary | ICD-10-CM | POA: Diagnosis not present

## 2021-07-26 DIAGNOSIS — F4310 Post-traumatic stress disorder, unspecified: Secondary | ICD-10-CM | POA: Diagnosis not present

## 2021-08-02 DIAGNOSIS — F4310 Post-traumatic stress disorder, unspecified: Secondary | ICD-10-CM | POA: Diagnosis not present

## 2021-08-02 DIAGNOSIS — F603 Borderline personality disorder: Secondary | ICD-10-CM | POA: Diagnosis not present

## 2021-08-09 DIAGNOSIS — F603 Borderline personality disorder: Secondary | ICD-10-CM | POA: Diagnosis not present

## 2021-08-09 DIAGNOSIS — F4310 Post-traumatic stress disorder, unspecified: Secondary | ICD-10-CM | POA: Diagnosis not present

## 2021-08-16 DIAGNOSIS — F603 Borderline personality disorder: Secondary | ICD-10-CM | POA: Diagnosis not present

## 2021-08-16 DIAGNOSIS — F4310 Post-traumatic stress disorder, unspecified: Secondary | ICD-10-CM | POA: Diagnosis not present

## 2021-08-23 DIAGNOSIS — F603 Borderline personality disorder: Secondary | ICD-10-CM | POA: Diagnosis not present

## 2021-08-23 DIAGNOSIS — F4310 Post-traumatic stress disorder, unspecified: Secondary | ICD-10-CM | POA: Diagnosis not present

## 2021-09-01 DIAGNOSIS — F603 Borderline personality disorder: Secondary | ICD-10-CM | POA: Diagnosis not present

## 2021-09-01 DIAGNOSIS — F4310 Post-traumatic stress disorder, unspecified: Secondary | ICD-10-CM | POA: Diagnosis not present

## 2021-09-06 DIAGNOSIS — F4310 Post-traumatic stress disorder, unspecified: Secondary | ICD-10-CM | POA: Diagnosis not present

## 2021-09-06 DIAGNOSIS — F603 Borderline personality disorder: Secondary | ICD-10-CM | POA: Diagnosis not present

## 2021-09-13 DIAGNOSIS — F603 Borderline personality disorder: Secondary | ICD-10-CM | POA: Diagnosis not present

## 2021-09-13 DIAGNOSIS — F4310 Post-traumatic stress disorder, unspecified: Secondary | ICD-10-CM | POA: Diagnosis not present

## 2021-09-20 DIAGNOSIS — F603 Borderline personality disorder: Secondary | ICD-10-CM | POA: Diagnosis not present

## 2021-09-20 DIAGNOSIS — F4310 Post-traumatic stress disorder, unspecified: Secondary | ICD-10-CM | POA: Diagnosis not present

## 2021-09-27 DIAGNOSIS — F4310 Post-traumatic stress disorder, unspecified: Secondary | ICD-10-CM | POA: Diagnosis not present

## 2021-09-27 DIAGNOSIS — F603 Borderline personality disorder: Secondary | ICD-10-CM | POA: Diagnosis not present

## 2021-10-04 DIAGNOSIS — F603 Borderline personality disorder: Secondary | ICD-10-CM | POA: Diagnosis not present

## 2021-10-04 DIAGNOSIS — F4310 Post-traumatic stress disorder, unspecified: Secondary | ICD-10-CM | POA: Diagnosis not present

## 2021-10-13 ENCOUNTER — Encounter: Payer: Self-pay | Admitting: Internal Medicine

## 2021-10-13 ENCOUNTER — Ambulatory Visit: Payer: Medicaid Other | Admitting: Internal Medicine

## 2021-10-13 VITALS — BP 118/70 | HR 72 | Resp 18 | Ht 69.0 in | Wt 213.4 lb

## 2021-10-13 DIAGNOSIS — F39 Unspecified mood [affective] disorder: Secondary | ICD-10-CM

## 2021-10-13 DIAGNOSIS — F3161 Bipolar disorder, current episode mixed, mild: Secondary | ICD-10-CM | POA: Diagnosis not present

## 2021-10-13 MED ORDER — CARIPRAZINE HCL 1.5 MG PO CAPS: 1.5000 mg | ORAL_CAPSULE | Freq: Every day | ORAL | 0 refills | Status: AC

## 2021-10-13 NOTE — Patient Instructions (Signed)
Please start taking Vraylar as prescribed.  You are being referred to Behavioral health center. [facility] [address] [place], [place] [address]

## 2021-10-13 NOTE — Progress Notes (Signed)
Established Patient Office Visit  Subjective:  Patient ID: Katherine Fletcher, adult    DOB: Oct 16, 1985  Age: 36 y.o. MRN: [id number]  CC:  Chief Complaint  Patient presents with   Acute Visit    Pt is requesting naltrexone for borderline personality disorder has therapist eli neal at [facility] currently    HPI Katherine Fletcher is a 35 y.o. adult with past medical history of mood disorder/bipolar disorder, PTSD, anemia and chronic sinusitis who presents for f/u of her chronic medical conditions.  Mood disorder/bipolar disorder: She complains of feeling of dissociation, lack of energy, insomnia and decreased concentration with agitation at times.  Of note, she reports chronic, intermittent flareups of depression in the past.  Of note, she had a second delivery in 01/23, and states that she had severe depression during pregnancy.  But, she does not have any depressive feelings now, but her dissociative symptoms are reappearing.  She has thoughts of self-harm, but denies any SI or HI currently.  She used to see psychiatrist in [place] at [facility] clinic and has been placed on Zoloft, Wellbutrin, lithium, Lamictal, Seroquel and Abilify in the past.  She did not respond well to Zoloft and Wellbutrin.  She had confusion with Seroquel.  She had mild akathisia with Abilify.  She currently takes care of her 2 kids, has good emotional support from her partner.  No concern for domestic or sexual abuse.        Past Medical History:  Diagnosis Date   Allergy    Anemia    Anxiety    [employer]rees[employer] 05/23/2020   Cesarean delivery delivered 11/21/2019   Depression    Depression    [employer] 05/23/2020   History of cesarean section 11/04/2019   Migraine    PTSD (post-traumatic stress disorder)    Vaginal Pap smear, abnormal     Past Surgical History:  Procedure Laterality Date   BREAST REDUCTION SURGERY     BREAST SURGERY N/A    [employer]ia 05/23/2020   CESAREAN SECTION N/A    [employer]ia 05/23/2020    COSMETIC SURGERY N/A    [employer]reesia 05/23/2020   TONSILLECTOMY     WISDOM TOOTH EXTRACTION      Family History  Problem Relation Age of Onset   Heart attack Paternal Grandfather    Arthritis Paternal Grandfather    Hypothyroidism Paternal Grandmother    Arthritis Paternal Grandmother    Cancer Maternal Grandmother    Heart disease Maternal Grandfather    Obesity Father    Rheum arthritis Father    Hypothyroidism Father    Obesity Mother    Depression Mother    Arthritis Mother    Obesity Sister    Polycystic ovary syndrome Sister     Social History   Socioeconomic History   Marital status: Significant Other    Spouse name: Casimiro Needle   Number of children: 1   Years of education: Not on file   Highest education level: Not on file  Occupational History   Not on file  Tobacco Use   Smoking status: Never    Passive exposure: Current   Smokeless tobacco: Never  Vaping Use   Vaping Use: Never used  Substance and Sexual Activity   Alcohol use: Yes    Comment: everyday   Drug use: No   Sexual activity: Not Currently    Birth control/protection: Condom  Other Topics Concern   Not on file  Social History Narrative   ** Merged History Encounter **  Social Determinants of Health   Financial Resource Strain: Medium Risk   Difficulty of Paying Living Expenses: Somewhat hard  Food Insecurity: No Food Insecurity   Worried About Programme researcher, broadcasting/film/video in the Last Year: Never true   Ran Out of Food in the Last Year: Never true  Transportation Needs: No Transportation Needs   Lack of Transportation (Medical): No   Lack of Transportation (Non-Medical): No  Physical Activity: Insufficiently Active   Days of Exercise per Week: 3 days   Minutes of Exercise per Session: 30 min  Stress: Stress Concern Present   Feeling of Stress : Very much  Social Connections: Socially Isolated   Frequency of Communication with Friends and Family: Three times a week   Frequency of Social  Gatherings with Friends and Family: Once a week   Attends Religious Services: Never   Database administrator or Organizations: No   Attends Engineer, structural: Never   Marital Status: Divorced  Catering manager Violence: Not At Risk   Fear of Current or Ex-Partner: No   Emotionally Abused: No   Physically Abused: No   Sexually Abused: No    Outpatient Medications Prior to Visit  Medication Sig Dispense Refill   cholecalciferol (VITAMIN D3) 25 MCG (1000 UNIT) tablet      Cobalamin Combinations (B-12) [phone] MCG SUBL      Multiple Vitamins-Minerals (MULTIVITAMIN WOMEN) TABS      Ferrous Sulfate Dried (SLOW IRON PO) Take by mouth.     ibuprofen (ADVIL) 600 MG tablet Take 1 tablet (600 mg total) by mouth every 6 (six) hours as needed (pain). 40 tablet 0   No facility-administered medications prior to visit.    Allergies  Allergen Reactions   Cat Hair Extract Anaphylaxis    Anxiety, rash, swelling, itching, nausea, shortness of breath   Other Hives, Shortness Of Breath, Itching, Nausea Only, Swelling, Anxiety, Rash and Other (See Comments)    Opiates-GI Upset, constipation, itching   Latex Rash    ROS Review of Systems  Constitutional:  Negative for chills and fever.  HENT:  Negative for congestion, sinus pressure, sinus pain and sore throat.   Eyes:  Negative for pain and discharge.  Respiratory:  Negative for cough and shortness of breath.   Cardiovascular:  Negative for chest pain and palpitations.  Gastrointestinal:  Negative for constipation, diarrhea, nausea and vomiting.  Endocrine: Negative for polydipsia and polyuria.  Genitourinary:  Negative for dysuria and hematuria.  Musculoskeletal:  Negative for neck pain and neck stiffness.  Skin:  Negative for rash.  Neurological:  Negative for dizziness, weakness, numbness and headaches.  Psychiatric/Behavioral:  Positive for decreased concentration and sleep disturbance. Negative for agitation, behavioral  problems and suicidal ideas. The patient is nervous/anxious.      Objective:    Physical Exam Vitals reviewed.  Constitutional:      General: She is not in acute distress.    Appearance: She is not diaphoretic.  HENT:     Head: Normocephalic and atraumatic.     Nose: Nose normal. No congestion.     Mouth/Throat:     Mouth: Mucous membranes are moist.     Pharynx: No posterior oropharyngeal erythema.  Eyes:     General: No scleral icterus.    Extraocular Movements: Extraocular movements intact.  Cardiovascular:     Rate and Rhythm: Normal rate and regular rhythm.     Pulses: Normal pulses.     Heart sounds: Normal heart sounds. No  murmur heard. Pulmonary:     Breath sounds: Normal breath sounds. No wheezing or rales.  Abdominal:     Palpations: Abdomen is soft.     Tenderness: There is no abdominal tenderness.  Musculoskeletal:     Cervical back: Neck supple. No tenderness.     Right lower leg: No edema.     Left lower leg: No edema.  Skin:    General: Skin is warm.     Findings: No rash.  Neurological:     General: No focal deficit present.     Mental Status: She is alert and oriented to person, place, and time.  Psychiatric:        Mood and Affect: Mood normal.        Speech: Speech is not rapid and pressured.        Behavior: Behavior normal. Behavior is cooperative.        Thought Content: Thought content does not include homicidal or suicidal ideation.    BP 118/70 (BP Location: Right Arm, Patient Position: Sitting, Cuff Size: Normal)   Pulse 72   Resp 18   Ht  (1.753 m)   Wt 213 lb 7.2 oz (96.8 kg)   SpO2 99%   BMI 31.52 kg/m  Wt Readings from Last 3 Encounters:  10/13/21 213 lb 7.2 oz (96.8 kg)  07/20/21 219 lb (99.3 kg)  06/29/21 216 lb (98 kg)    Lab Results  Component Value Date   TSH 1.120 05/26/2020   Lab Results  Component Value Date   WBC 10.6 (H) 06/16/2021   HGB 10.7 (L) 06/16/2021   HCT 30.5 (L) 06/16/2021   MCV 99.0 06/16/2021    PLT 129 (L) 06/16/2021   Lab Results  Component Value Date   NA 130 (L) 04/13/2021   K 3.7 04/13/2021   CO2 21 (L) 04/13/2021   GLUCOSE 95 04/13/2021   BUN 5 (L) 04/13/2021   CREATININE 0.49 04/13/2021   BILITOT 0.7 04/13/2021   ALKPHOS 75 04/13/2021   AST 17 04/13/2021   ALT 15 04/13/2021   PROT 6.6 04/13/2021   ALBUMIN 3.2 (L) 04/13/2021   CALCIUM 8.1 (L) 04/13/2021   ANIONGAP 8 04/13/2021   Lab Results  Component Value Date   CHOL 156 05/26/2020   Lab Results  Component Value Date   HDL 47 05/26/2020   Lab Results  Component Value Date   LDLCALC 95 05/26/2020   Lab Results  Component Value Date   TRIG 74 05/26/2020   Lab Results  Component Value Date   CHOLHDL 3.3 05/26/2020   No results found for: HGBA1C    Assessment & Plan:   Problem List Items Addressed This Visit       Other   Mood disorder (HCC) - Primary   Relevant Medications   cariprazine (VRAYLAR) 1.5 MG capsule   Other Relevant Orders   Ambulatory referral to Psychiatry   Bipolar disorder, current episode mixed, mild (HCC)    Appears to have bipolar depression currently Used to see psychiatrist in [place] at [facility] clinic Has been been placed on Zoloft, Wellbutrin, lithium, Lamictal, Seroquel and Abilify amongst others -did not respond well and/or had adverse reactions Had lengthy discussion about her symptoms and safety plan-counseling provided Referred to psychiatry in [place] No concern for SI or HI currently -provided resources in case of mental health emergency         Relevant Medications   cariprazine (VRAYLAR) 1.5 MG capsule  Other Relevant Orders   Ambulatory referral to Psychiatry    Meds ordered this encounter  Medications   cariprazine (VRAYLAR) 1.5 MG capsule    Sig: Take 1 capsule (1.5 mg total) by mouth daily.    Dispense:  30 capsule    Refill:  0    Follow-up: Return in about 3 months (around 01/13/2022).    Anabel Halonutwik K Grier Vu,  MD

## 2021-10-13 NOTE — Assessment & Plan Note (Addendum)
Appears to have bipolar depression currently Used to see psychiatrist in [place] at [facility] clinic Has been been placed on Zoloft, Wellbutrin, lithium, Lamictal, Seroquel and Abilify amongst others -did not respond well and/or had adverse reactions Had lengthy discussion about her symptoms and safety plan-counseling provided Referred to psychiatry in [facility] No concern for SI or HI currently -provided resources in case of mental health emergency

## 2021-10-18 DIAGNOSIS — F4310 Post-traumatic stress disorder, unspecified: Secondary | ICD-10-CM | POA: Diagnosis not present

## 2021-10-18 DIAGNOSIS — F603 Borderline personality disorder: Secondary | ICD-10-CM | POA: Diagnosis not present

## 2021-10-24 ENCOUNTER — Ambulatory Visit: Payer: Medicaid Other | Admitting: Internal Medicine

## 2021-10-26 DIAGNOSIS — F4310 Post-traumatic stress disorder, unspecified: Secondary | ICD-10-CM | POA: Diagnosis not present

## 2021-10-26 DIAGNOSIS — F603 Borderline personality disorder: Secondary | ICD-10-CM | POA: Diagnosis not present

## 2021-11-01 DIAGNOSIS — F4310 Post-traumatic stress disorder, unspecified: Secondary | ICD-10-CM | POA: Diagnosis not present

## 2021-11-01 DIAGNOSIS — F603 Borderline personality disorder: Secondary | ICD-10-CM | POA: Diagnosis not present

## 2021-11-08 DIAGNOSIS — F4310 Post-traumatic stress disorder, unspecified: Secondary | ICD-10-CM | POA: Diagnosis not present

## 2021-11-08 DIAGNOSIS — F603 Borderline personality disorder: Secondary | ICD-10-CM | POA: Diagnosis not present

## 2021-12-13 DIAGNOSIS — F603 Borderline personality disorder: Secondary | ICD-10-CM | POA: Diagnosis not present

## 2021-12-13 DIAGNOSIS — F4310 Post-traumatic stress disorder, unspecified: Secondary | ICD-10-CM | POA: Diagnosis not present

## 2021-12-15 IMAGING — DX DG CHEST 1V PORT
1 series · 1 of 1 positions shown · non-contrast
Comparison: None.

CLINICAL DATA: Cough and sore throat, 29 weeks pregnant

EXAM:
PORTABLE CHEST 1 VIEW

[chest ap]
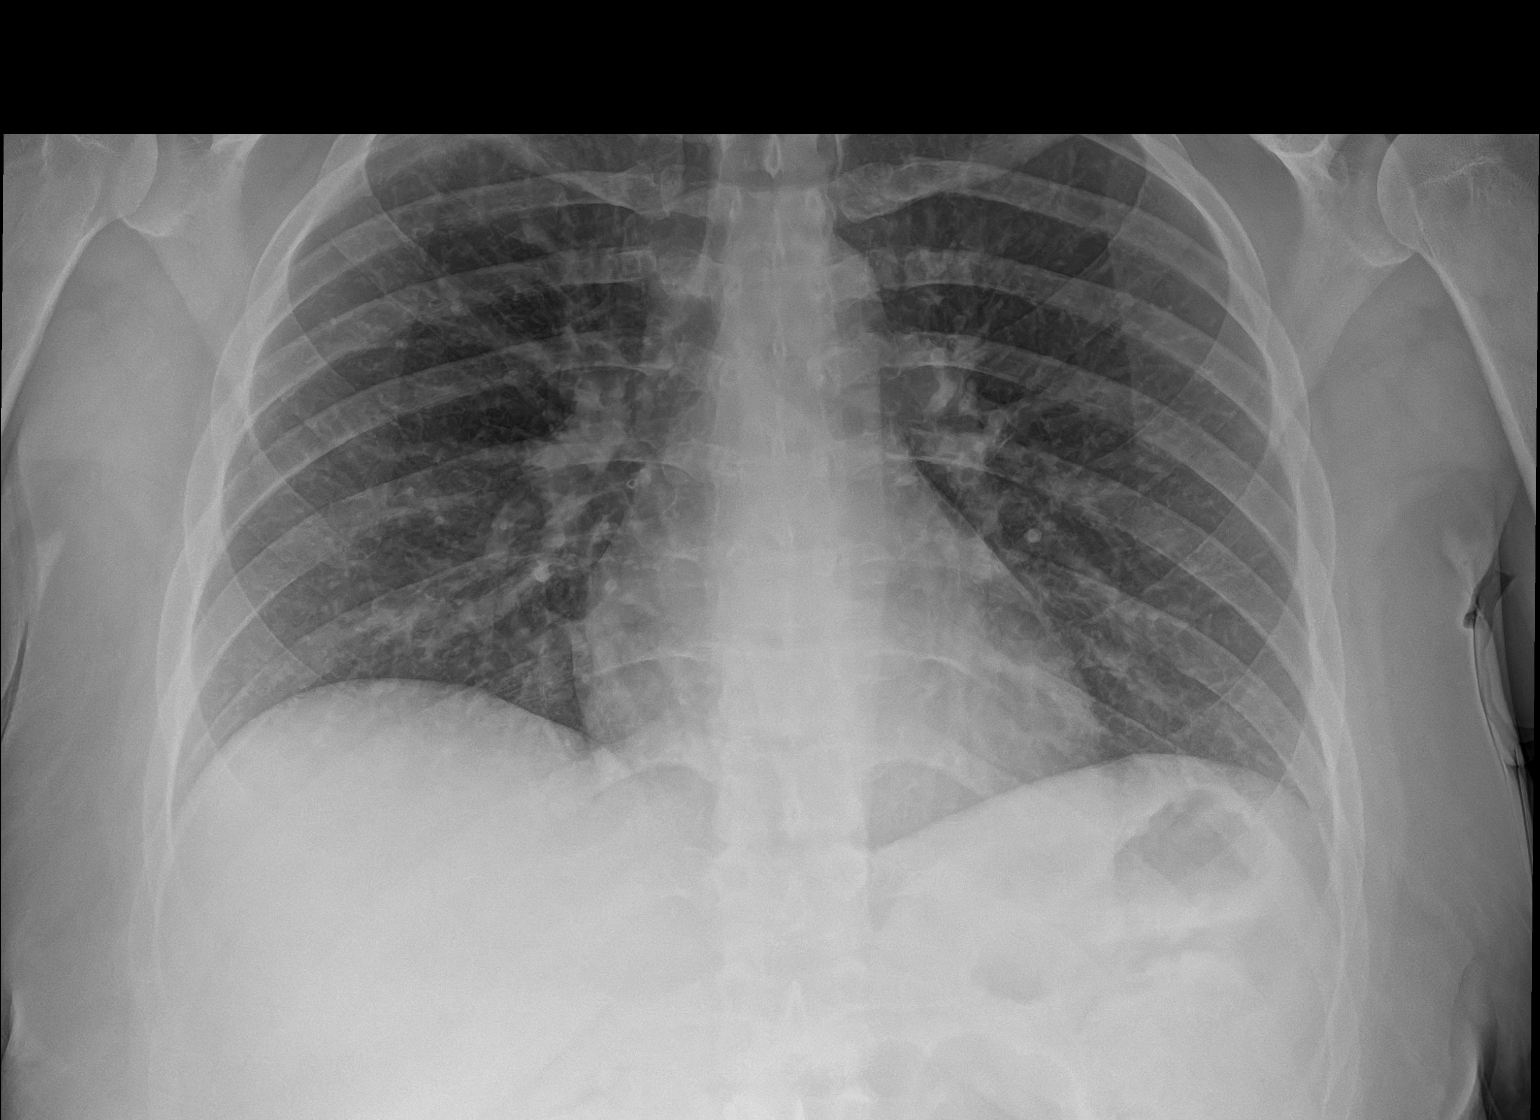

[1 of 1 positions shown; findings below may reference images not displayed]

FINDINGS: Cardiac shadow is within normal limits. Lungs are clear. No bony
abnormality is noted.
IMPRESSION: No active disease.

## 2021-12-20 DIAGNOSIS — F603 Borderline personality disorder: Secondary | ICD-10-CM | POA: Diagnosis not present

## 2021-12-20 DIAGNOSIS — F4310 Post-traumatic stress disorder, unspecified: Secondary | ICD-10-CM | POA: Diagnosis not present

## 2021-12-27 DIAGNOSIS — F603 Borderline personality disorder: Secondary | ICD-10-CM | POA: Diagnosis not present

## 2021-12-27 DIAGNOSIS — F4310 Post-traumatic stress disorder, unspecified: Secondary | ICD-10-CM | POA: Diagnosis not present

## 2022-01-03 DIAGNOSIS — F603 Borderline personality disorder: Secondary | ICD-10-CM | POA: Diagnosis not present

## 2022-01-03 DIAGNOSIS — F4310 Post-traumatic stress disorder, unspecified: Secondary | ICD-10-CM | POA: Diagnosis not present

## 2022-01-16 ENCOUNTER — Ambulatory Visit: Payer: Medicaid Other | Admitting: Internal Medicine

## 2022-01-24 DIAGNOSIS — F603 Borderline personality disorder: Secondary | ICD-10-CM | POA: Diagnosis not present

## 2022-01-24 DIAGNOSIS — F4310 Post-traumatic stress disorder, unspecified: Secondary | ICD-10-CM | POA: Diagnosis not present

## 2022-02-03 DIAGNOSIS — Z0000 Encounter for general adult medical examination without abnormal findings: Secondary | ICD-10-CM | POA: Diagnosis not present

## 2022-02-21 DIAGNOSIS — F4310 Post-traumatic stress disorder, unspecified: Secondary | ICD-10-CM | POA: Diagnosis not present

## 2022-02-21 DIAGNOSIS — F603 Borderline personality disorder: Secondary | ICD-10-CM | POA: Diagnosis not present

## 2022-05-25 DIAGNOSIS — L219 Seborrheic dermatitis, unspecified: Secondary | ICD-10-CM | POA: Diagnosis not present

## 2022-05-25 DIAGNOSIS — G8929 Other chronic pain: Secondary | ICD-10-CM | POA: Diagnosis not present

## 2022-05-25 DIAGNOSIS — Z842 Family history of other diseases of the genitourinary system: Secondary | ICD-10-CM | POA: Diagnosis not present

## 2022-05-25 DIAGNOSIS — L309 Dermatitis, unspecified: Secondary | ICD-10-CM | POA: Diagnosis not present

## 2022-05-25 DIAGNOSIS — M25562 Pain in left knee: Secondary | ICD-10-CM | POA: Diagnosis not present

## 2022-05-25 DIAGNOSIS — M25561 Pain in right knee: Secondary | ICD-10-CM | POA: Diagnosis not present

## 2022-06-09 DIAGNOSIS — N946 Dysmenorrhea, unspecified: Secondary | ICD-10-CM | POA: Diagnosis not present

## 2022-06-09 DIAGNOSIS — G43109 Migraine with aura, not intractable, without status migrainosus: Secondary | ICD-10-CM | POA: Diagnosis not present

## 2022-06-09 DIAGNOSIS — R42 Dizziness and giddiness: Secondary | ICD-10-CM | POA: Diagnosis not present

## 2022-06-09 DIAGNOSIS — Z3009 Encounter for other general counseling and advice on contraception: Secondary | ICD-10-CM | POA: Diagnosis not present

## 2022-06-09 DIAGNOSIS — K068 Other specified disorders of gingiva and edentulous alveolar ridge: Secondary | ICD-10-CM | POA: Diagnosis not present

## 2022-06-09 DIAGNOSIS — N920 Excessive and frequent menstruation with regular cycle: Secondary | ICD-10-CM | POA: Diagnosis not present

## 2022-06-15 DIAGNOSIS — M6281 Muscle weakness (generalized): Secondary | ICD-10-CM | POA: Diagnosis not present

## 2022-06-15 DIAGNOSIS — M25561 Pain in right knee: Secondary | ICD-10-CM | POA: Diagnosis not present

## 2022-06-15 DIAGNOSIS — R29898 Other symptoms and signs involving the musculoskeletal system: Secondary | ICD-10-CM | POA: Diagnosis not present

## 2022-06-15 DIAGNOSIS — M25562 Pain in left knee: Secondary | ICD-10-CM | POA: Diagnosis not present

## 2022-06-15 DIAGNOSIS — Z7409 Other reduced mobility: Secondary | ICD-10-CM | POA: Diagnosis not present

## 2022-06-15 DIAGNOSIS — G8929 Other chronic pain: Secondary | ICD-10-CM | POA: Diagnosis not present

## 2022-07-13 DIAGNOSIS — Z7409 Other reduced mobility: Secondary | ICD-10-CM | POA: Diagnosis not present

## 2022-07-13 DIAGNOSIS — M6281 Muscle weakness (generalized): Secondary | ICD-10-CM | POA: Diagnosis not present

## 2022-07-13 DIAGNOSIS — G8929 Other chronic pain: Secondary | ICD-10-CM | POA: Diagnosis not present

## 2022-07-13 DIAGNOSIS — M25561 Pain in right knee: Secondary | ICD-10-CM | POA: Diagnosis not present

## 2022-07-13 DIAGNOSIS — M25562 Pain in left knee: Secondary | ICD-10-CM | POA: Diagnosis not present

## 2022-07-13 DIAGNOSIS — R29898 Other symptoms and signs involving the musculoskeletal system: Secondary | ICD-10-CM | POA: Diagnosis not present

## 2022-08-09 DIAGNOSIS — Z7409 Other reduced mobility: Secondary | ICD-10-CM | POA: Diagnosis not present

## 2022-08-09 DIAGNOSIS — R29898 Other symptoms and signs involving the musculoskeletal system: Secondary | ICD-10-CM | POA: Diagnosis not present

## 2022-08-09 DIAGNOSIS — M25561 Pain in right knee: Secondary | ICD-10-CM | POA: Diagnosis not present

## 2022-08-09 DIAGNOSIS — G8929 Other chronic pain: Secondary | ICD-10-CM | POA: Diagnosis not present

## 2022-08-09 DIAGNOSIS — M25562 Pain in left knee: Secondary | ICD-10-CM | POA: Diagnosis not present

## 2022-08-09 DIAGNOSIS — M6281 Muscle weakness (generalized): Secondary | ICD-10-CM | POA: Diagnosis not present

## 2022-08-17 DIAGNOSIS — M6281 Muscle weakness (generalized): Secondary | ICD-10-CM | POA: Diagnosis not present

## 2022-08-17 DIAGNOSIS — Z7409 Other reduced mobility: Secondary | ICD-10-CM | POA: Diagnosis not present

## 2022-08-17 DIAGNOSIS — G8929 Other chronic pain: Secondary | ICD-10-CM | POA: Diagnosis not present

## 2022-08-17 DIAGNOSIS — M25561 Pain in right knee: Secondary | ICD-10-CM | POA: Diagnosis not present

## 2022-08-17 DIAGNOSIS — R29898 Other symptoms and signs involving the musculoskeletal system: Secondary | ICD-10-CM | POA: Diagnosis not present

## 2022-08-17 DIAGNOSIS — M25562 Pain in left knee: Secondary | ICD-10-CM | POA: Diagnosis not present

## 2022-08-28 DIAGNOSIS — U071 COVID-19: Secondary | ICD-10-CM | POA: Diagnosis not present

## 2022-09-15 DIAGNOSIS — J0140 Acute pansinusitis, unspecified: Secondary | ICD-10-CM | POA: Diagnosis not present

## 2022-09-15 DIAGNOSIS — R509 Fever, unspecified: Secondary | ICD-10-CM | POA: Diagnosis not present

## 2023-03-15 DIAGNOSIS — Z202 Contact with and (suspected) exposure to infections with a predominantly sexual mode of transmission: Secondary | ICD-10-CM | POA: Diagnosis not present

## 2023-07-04 DIAGNOSIS — Z113 Encounter for screening for infections with a predominantly sexual mode of transmission: Secondary | ICD-10-CM | POA: Diagnosis not present

## 2023-07-25 DIAGNOSIS — S61012A Laceration without foreign body of left thumb without damage to nail, initial encounter: Secondary | ICD-10-CM | POA: Diagnosis not present

## 2023-07-25 DIAGNOSIS — Z578 Occupational exposure to other risk factors: Secondary | ICD-10-CM | POA: Diagnosis not present

## 2023-07-26 DIAGNOSIS — N898 Other specified noninflammatory disorders of vagina: Secondary | ICD-10-CM | POA: Diagnosis not present

## 2023-07-26 DIAGNOSIS — S30814A Abrasion of vagina and vulva, initial encounter: Secondary | ICD-10-CM | POA: Diagnosis not present

## 2023-08-27 DIAGNOSIS — L259 Unspecified contact dermatitis, unspecified cause: Secondary | ICD-10-CM | POA: Diagnosis not present

## 2023-08-30 DIAGNOSIS — L232 Allergic contact dermatitis due to cosmetics: Secondary | ICD-10-CM | POA: Diagnosis not present

## 2023-08-30 DIAGNOSIS — L2389 Allergic contact dermatitis due to other agents: Secondary | ICD-10-CM | POA: Diagnosis not present

## 2023-09-27 ENCOUNTER — Other Ambulatory Visit (HOSPITAL_COMMUNITY): Payer: Self-pay
